# Patient Record
Sex: Male | Born: 1970 | Race: White | Hispanic: No | Marital: Single | Smoking: Current every day smoker
Health system: Southern US, Community
[De-identification: ages and names within clinical notes are randomized; demographics above are authoritative.]

## PROBLEM LIST (undated history)

## (undated) DIAGNOSIS — I82409 Acute embolism and thrombosis of unspecified deep veins of unspecified lower extremity: Secondary | ICD-10-CM

## (undated) DIAGNOSIS — F10239 Alcohol dependence with withdrawal, unspecified: Secondary | ICD-10-CM

## (undated) DIAGNOSIS — I2510 Atherosclerotic heart disease of native coronary artery without angina pectoris: Secondary | ICD-10-CM

## (undated) HISTORY — PX: CARDIAC CATHETERIZATION: SHX172

---

## 2016-11-11 ENCOUNTER — Encounter (HOSPITAL_COMMUNITY): Payer: Self-pay

## 2016-11-11 ENCOUNTER — Emergency Department (HOSPITAL_COMMUNITY): Payer: Self-pay

## 2016-11-11 ENCOUNTER — Emergency Department (HOSPITAL_COMMUNITY)
Admission: EM | Admit: 2016-11-11 | Discharge: 2016-11-12 | Disposition: A | Discharge status: Home / Self Care | Payer: Self-pay | Attending: Emergency Medicine | Admitting: Emergency Medicine

## 2016-11-11 ENCOUNTER — Emergency Department (HOSPITAL_COMMUNITY)
Admission: EM | Admit: 2016-11-11 | Discharge: 2016-11-11 | Disposition: A | Discharge status: Home / Self Care | Payer: Self-pay | Attending: Emergency Medicine | Admitting: Emergency Medicine

## 2016-11-11 DIAGNOSIS — Y999 Unspecified external cause status: Secondary | ICD-10-CM | POA: Insufficient documentation

## 2016-11-11 DIAGNOSIS — R55 Syncope and collapse: Secondary | ICD-10-CM | POA: Insufficient documentation

## 2016-11-11 DIAGNOSIS — F10920 Alcohol use, unspecified with intoxication, uncomplicated: Secondary | ICD-10-CM

## 2016-11-11 DIAGNOSIS — W1809XA Striking against other object with subsequent fall, initial encounter: Secondary | ICD-10-CM | POA: Insufficient documentation

## 2016-11-11 DIAGNOSIS — R072 Precordial pain: Secondary | ICD-10-CM | POA: Insufficient documentation

## 2016-11-11 DIAGNOSIS — S0083XA Contusion of other part of head, initial encounter: Secondary | ICD-10-CM | POA: Insufficient documentation

## 2016-11-11 DIAGNOSIS — I2510 Atherosclerotic heart disease of native coronary artery without angina pectoris: Secondary | ICD-10-CM | POA: Insufficient documentation

## 2016-11-11 DIAGNOSIS — Y939 Activity, unspecified: Secondary | ICD-10-CM | POA: Insufficient documentation

## 2016-11-11 DIAGNOSIS — R079 Chest pain, unspecified: Secondary | ICD-10-CM

## 2016-11-11 DIAGNOSIS — Y92481 Parking lot as the place of occurrence of the external cause: Secondary | ICD-10-CM | POA: Insufficient documentation

## 2016-11-11 DIAGNOSIS — F17200 Nicotine dependence, unspecified, uncomplicated: Secondary | ICD-10-CM | POA: Insufficient documentation

## 2016-11-11 DIAGNOSIS — Z5181 Encounter for therapeutic drug level monitoring: Secondary | ICD-10-CM | POA: Insufficient documentation

## 2016-11-11 DIAGNOSIS — F10120 Alcohol abuse with intoxication, uncomplicated: Secondary | ICD-10-CM | POA: Insufficient documentation

## 2016-11-11 HISTORY — DX: Atherosclerotic heart disease of native coronary artery without angina pectoris: I25.10

## 2016-11-11 HISTORY — DX: Acute embolism and thrombosis of unspecified deep veins of unspecified lower extremity: I82.409

## 2016-11-11 LAB — BASIC METABOLIC PANEL
Anion gap: 8 (ref 5–15)
BUN: 7 mg/dL (ref 6–20)
CO2: 24 mmol/L (ref 22–32)
Calcium: 8.8 mg/dL — ABNORMAL LOW (ref 8.9–10.3)
Chloride: 96 mmol/L — ABNORMAL LOW (ref 101–111)
Creatinine, Ser: 0.63 mg/dL (ref 0.61–1.24)
GFR calc Af Amer: >60 mL/min (ref >60)
GFR calc non Af Amer: >60 mL/min (ref >60)
Glucose, Bld: 98 mg/dL (ref 65–99)
Potassium: 4.6 mmol/L (ref 3.5–5.1)
Sodium: 128 mmol/L — ABNORMAL LOW (ref 135–145)

## 2016-11-11 LAB — CBC
HCT: 35.2 % — ABNORMAL LOW (ref 39.0–52.0)
Hemoglobin: 12.4 g/dL — ABNORMAL LOW (ref 13.0–17.0)
MCH: 38.4 pg — ABNORMAL HIGH (ref 26.0–34.0)
MCHC: 35.2 g/dL (ref 30.0–36.0)
MCV: 109 fL — ABNORMAL HIGH (ref 78.0–100.0)
Platelets: 119 10*3/uL — ABNORMAL LOW (ref 150–400)
RBC: 3.23 MIL/uL — ABNORMAL LOW (ref 4.22–5.81)
RDW: 12.7 % (ref 11.5–15.5)
WBC: 4.5 10*3/uL (ref 4.0–10.5)

## 2016-11-11 LAB — TROPONIN I: Troponin I: <0.03 ng/mL (ref <0.03)

## 2016-11-11 MED ORDER — FENTANYL CITRATE (PF) 100 MCG/2ML IJ SOLN
100.0000 ug | Freq: Once | INTRAMUSCULAR | Status: AC
  Administered 2016-11-11: 100 ug via INTRAVENOUS
  Filled 2016-11-11: qty 2

## 2016-11-11 MED ORDER — SODIUM CHLORIDE 0.9 % IV BOLUS (SEPSIS)
1000.0000 mL | Freq: Once | INTRAVENOUS | Status: AC
  Administered 2016-11-11: 1000 mL via INTRAVENOUS

## 2016-11-11 MED ORDER — SODIUM CHLORIDE 0.9 % IV BOLUS (SEPSIS)
1000.0000 mL | Freq: Once | INTRAVENOUS | Status: AC
  Administered 2016-11-12: 1000 mL via INTRAVENOUS

## 2016-11-11 NOTE — ED Notes (Signed)
Assumed care of patient from [place], [place]. Pt resting quietly at this time. No distress. No complaints.
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
## 2016-11-11 NOTE — ED Provider Notes (Signed)
[facility] Provider Note   CSN: [id number] Arrival date & time: 11/11/16  1421     History   Chief Complaint Chief Complaint  Patient presents with  . Chest Pain    HPI Derek Norton is a 46 y.o. male.   Chest Pain   This is a new problem. The current episode started less than 1 hour ago. The problem occurs constantly. The problem has not changed since onset.The pain is present in the substernal region. The pain is mild. The quality of the pain is described as brief and sharp. The pain radiates to the left shoulder. The symptoms are aggravated by deep breathing.    Past Medical History:  Diagnosis Date  . Coronary artery disease   . DVT (deep venous thrombosis) (HCC)     There are no active problems to display for this patient.   Past Surgical History:  Procedure Laterality Date  . CARDIAC CATHETERIZATION         Home Medications    Prior to Admission medications   Not on File    Family History History reviewed. No pertinent family history.  Social History Social History  Substance Use Topics  . Smoking status: Not on file  . Smokeless tobacco: Never Used  . Alcohol use Not on file     Allergies   Penicillins   Review of Systems Review of Systems  Cardiovascular: Positive for chest pain.  All other systems reviewed and are negative.    Physical Exam Updated Vital Signs BP 102/67   Pulse 72   Temp 98.5 F (36.9 C) (Oral)   Resp 11   Ht  (1.778 m)   Wt 150 lb (68 kg)   SpO2 99%   BMI 21.52 kg/m   Physical Exam  Constitutional: He is oriented to person, place, and time. He appears well-developed and well-nourished.  HENT:  Head: Normocephalic and atraumatic.  Eyes: Conjunctivae and EOM are normal.  Neck: Normal range of motion.  Cardiovascular: Normal rate and regular rhythm.  Exam reveals no gallop and no friction rub.   No murmur heard. Pulmonary/Chest: Effort normal. No respiratory distress. He has no wheezes.  He exhibits no tenderness.  Abdominal: Soft. He exhibits no distension.  Musculoskeletal: Normal range of motion. He exhibits no edema or deformity.  Neurological: He is alert and oriented to person, place, and time. No cranial nerve deficit.  Nursing note and vitals reviewed.    ED Treatments / Results  Labs (all labs ordered are listed, but only abnormal results are displayed) Labs Reviewed  BASIC METABOLIC PANEL - Abnormal; Notable for the following:       Result Value   Sodium 128 (*)    Chloride 96 (*)    Calcium 8.8 (*)    All other components within normal limits  CBC - Abnormal; Notable for the following:    RBC 3.23 (*)    Hemoglobin 12.4 (*)    HCT 35.2 (*)    MCV 109.0 (*)    MCH 38.4 (*)    Platelets 119 (*)    All other components within normal limits  TROPONIN I    EKG  EKG Interpretation  Date/Time:  Wednesday November 11 2016 14:35:17 EDT Ventricular Rate:  76 PR Interval:    QRS Duration: 88 QT Interval:  389 QTC Calculation: 438 R Axis:   80 Text Interpretation:  Sinus rhythm ST elev, probable normal early repol pattern No old tracing to compare Confirmed by  Jensen Cheramie MD, Barbara Cower [phone]) on 11/11/2016 2:38:03 PM      PREVIOUS ECG:    Radiology Dg Chest 2 View  Result Date: 11/11/2016 CLINICAL DATA:  Chronic chest pain, increasing today. EXAM: CHEST  2 VIEW COMPARISON:  None. FINDINGS: The cardiomediastinal silhouette is unremarkable. There is no evidence of focal airspace disease, pulmonary edema, suspicious pulmonary nodule/mass, pleural effusion, or pneumothorax. No acute bony abnormalities are identified. IMPRESSION: No active cardiopulmonary disease. Electronically Signed   By: Harmon Pier M.D.   On: 11/11/2016 16:05    Procedures Procedures (including critical care time)  Medications Ordered in ED Medications  sodium chloride 0.9 % bolus 1,000 mL (0 mLs Intravenous Stopped 11/11/16 1643)  fentaNYL (SUBLIMAZE) injection 100 mcg (100 mcg  Intravenous Given 11/11/16 1613)     Initial Impression / Assessment and Plan / ED Course  I have reviewed the triage vital signs and the nursing notes.  Pertinent labs & imaging results that were available during my care of the patient were reviewed by me and considered in my medical decision making (see chart for details).     h/o ACS, negative troponin, ecg similar to previous. Left AMA before full workup to be completed which would have included further observation for return of pain, vital signs, arrhythmia and a second ecg and second troponin to ensure no ischemia. competemnt to make decisiion. Clinically sober at time of departure.   Final Clinical Impressions(s) / ED Diagnoses   Final diagnoses:  Nonspecific chest pain      Marily Memos, MD 11/11/16 2059
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
## 2016-11-11 NOTE — ED Notes (Signed)
Pt removed c collar himself in room
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
## 2016-11-12 LAB — COMPREHENSIVE METABOLIC PANEL
ALT: 117 U/L — ABNORMAL HIGH (ref 17–63)
AST: 75 U/L — ABNORMAL HIGH (ref 15–41)
Albumin: 3.7 g/dL (ref 3.5–5.0)
Alkaline Phosphatase: 70 U/L (ref 38–126)
Anion gap: 9 (ref 5–15)
BUN: 6 mg/dL (ref 6–20)
CO2: 22 mmol/L (ref 22–32)
Calcium: 8.1 mg/dL — ABNORMAL LOW (ref 8.9–10.3)
Chloride: 101 mmol/L (ref 101–111)
Creatinine, Ser: 0.67 mg/dL (ref 0.61–1.24)
GFR calc Af Amer: >60 mL/min (ref >60)
GFR calc non Af Amer: >60 mL/min (ref >60)
Glucose, Bld: 95 mg/dL (ref 65–99)
Potassium: 3.8 mmol/L (ref 3.5–5.1)
Sodium: 132 mmol/L — ABNORMAL LOW (ref 135–145)
Total Bilirubin: 0.3 mg/dL (ref 0.3–1.2)
Total Protein: 6.3 g/dL — ABNORMAL LOW (ref 6.5–8.1)

## 2016-11-12 LAB — CBC WITH DIFFERENTIAL/PLATELET
Basophils Absolute: 0 10*3/uL (ref 0.0–0.1)
Basophils Relative: 1 %
Eosinophils Absolute: 0 10*3/uL (ref 0.0–0.7)
Eosinophils Relative: 1 %
HCT: 33.1 % — ABNORMAL LOW (ref 39.0–52.0)
Hemoglobin: 11.7 g/dL — ABNORMAL LOW (ref 13.0–17.0)
Lymphocytes Relative: 32 %
Lymphs Abs: 1 10*3/uL (ref 0.7–4.0)
MCH: 38.5 pg — ABNORMAL HIGH (ref 26.0–34.0)
MCHC: 35.3 g/dL (ref 30.0–36.0)
MCV: 108.9 fL — ABNORMAL HIGH (ref 78.0–100.0)
Monocytes Absolute: 0.3 10*3/uL (ref 0.1–1.0)
Monocytes Relative: 11 %
Neutro Abs: 1.7 10*3/uL (ref 1.7–7.7)
Neutrophils Relative %: 55 %
Platelets: 116 10*3/uL — ABNORMAL LOW (ref 150–400)
RBC: 3.04 MIL/uL — ABNORMAL LOW (ref 4.22–5.81)
RDW: 12.8 % (ref 11.5–15.5)
WBC: 3.2 10*3/uL — ABNORMAL LOW (ref 4.0–10.5)

## 2016-11-12 LAB — I-STAT TROPONIN, ED: Troponin i, poc: 0 ng/mL (ref 0.00–0.08)

## 2016-11-12 LAB — RAPID URINE DRUG SCREEN, HOSP PERFORMED
Amphetamines: NOT DETECTED
Barbiturates: NOT DETECTED
Benzodiazepines: NOT DETECTED
Cocaine: NOT DETECTED
Opiates: NOT DETECTED
Tetrahydrocannabinol: NOT DETECTED

## 2016-11-12 LAB — ETHANOL: Alcohol, Ethyl (B): 216 mg/dL — ABNORMAL HIGH (ref <5)

## 2016-11-12 MED ORDER — ACETAMINOPHEN 500 MG PO TABS
1000.0000 mg | ORAL_TABLET | Freq: Once | ORAL | Status: AC
  Administered 2016-11-12: 1000 mg via ORAL
  Filled 2016-11-12: qty 2

## 2016-11-12 NOTE — ED Notes (Signed)
Patient states "I'm ready to go. I don't want anything to eat, I'm just ready to go."

## 2016-11-12 NOTE — ED Notes (Signed)
Patient ambulating with no assistance or difficulty. Gait steady at this time.

## 2017-12-07 ENCOUNTER — Encounter (HOSPITAL_COMMUNITY): Payer: Self-pay | Admitting: Emergency Medicine

## 2017-12-07 ENCOUNTER — Emergency Department (HOSPITAL_COMMUNITY): Payer: Self-pay

## 2017-12-07 ENCOUNTER — Other Ambulatory Visit: Payer: Self-pay

## 2017-12-07 ENCOUNTER — Emergency Department (HOSPITAL_COMMUNITY)
Admission: EM | Admit: 2017-12-07 | Discharge: 2017-12-07 | Disposition: A | Discharge status: Home / Self Care | Payer: Self-pay | Attending: Emergency Medicine | Admitting: Emergency Medicine

## 2017-12-07 DIAGNOSIS — Z590 Homelessness: Secondary | ICD-10-CM | POA: Insufficient documentation

## 2017-12-07 DIAGNOSIS — F10939 Alcohol use, unspecified with withdrawal, unspecified: Secondary | ICD-10-CM

## 2017-12-07 DIAGNOSIS — F10239 Alcohol dependence with withdrawal, unspecified: Secondary | ICD-10-CM

## 2017-12-07 DIAGNOSIS — F329 Major depressive disorder, single episode, unspecified: Secondary | ICD-10-CM | POA: Insufficient documentation

## 2017-12-07 DIAGNOSIS — F17200 Nicotine dependence, unspecified, uncomplicated: Secondary | ICD-10-CM | POA: Insufficient documentation

## 2017-12-07 DIAGNOSIS — Y939 Activity, unspecified: Secondary | ICD-10-CM | POA: Insufficient documentation

## 2017-12-07 DIAGNOSIS — F1010 Alcohol abuse, uncomplicated: Secondary | ICD-10-CM

## 2017-12-07 DIAGNOSIS — Z5329 Procedure and treatment not carried out because of patient's decision for other reasons: Secondary | ICD-10-CM | POA: Insufficient documentation

## 2017-12-07 DIAGNOSIS — R0602 Shortness of breath: Secondary | ICD-10-CM | POA: Insufficient documentation

## 2017-12-07 DIAGNOSIS — W0110XA Fall on same level from slipping, tripping and stumbling with subsequent striking against unspecified object, initial encounter: Secondary | ICD-10-CM | POA: Insufficient documentation

## 2017-12-07 DIAGNOSIS — R1084 Generalized abdominal pain: Secondary | ICD-10-CM | POA: Insufficient documentation

## 2017-12-07 DIAGNOSIS — R0789 Other chest pain: Secondary | ICD-10-CM | POA: Insufficient documentation

## 2017-12-07 DIAGNOSIS — Y929 Unspecified place or not applicable: Secondary | ICD-10-CM | POA: Insufficient documentation

## 2017-12-07 DIAGNOSIS — I2510 Atherosclerotic heart disease of native coronary artery without angina pectoris: Secondary | ICD-10-CM | POA: Insufficient documentation

## 2017-12-07 DIAGNOSIS — F10129 Alcohol abuse with intoxication, unspecified: Secondary | ICD-10-CM | POA: Insufficient documentation

## 2017-12-07 DIAGNOSIS — Y999 Unspecified external cause status: Secondary | ICD-10-CM | POA: Insufficient documentation

## 2017-12-07 HISTORY — DX: Alcohol dependence with withdrawal, unspecified: F10.239

## 2017-12-07 HISTORY — DX: Alcohol use, unspecified with withdrawal, unspecified: F10.939

## 2017-12-07 LAB — COMPREHENSIVE METABOLIC PANEL
ALT: 65 U/L — ABNORMAL HIGH (ref 17–63)
AST: 109 U/L — ABNORMAL HIGH (ref 15–41)
Albumin: 4.3 g/dL (ref 3.5–5.0)
Alkaline Phosphatase: 82 U/L (ref 38–126)
Anion gap: 12 (ref 5–15)
BUN: 7 mg/dL (ref 6–20)
CO2: 23 mmol/L (ref 22–32)
Calcium: 8.3 mg/dL — ABNORMAL LOW (ref 8.9–10.3)
Chloride: 97 mmol/L — ABNORMAL LOW (ref 101–111)
Creatinine, Ser: 0.49 mg/dL — ABNORMAL LOW (ref 0.61–1.24)
GFR calc Af Amer: >60 mL/min (ref >60)
GFR calc non Af Amer: >60 mL/min (ref >60)
Glucose, Bld: 79 mg/dL (ref 65–99)
Potassium: 3.5 mmol/L (ref 3.5–5.1)
Sodium: 132 mmol/L — ABNORMAL LOW (ref 135–145)
Total Bilirubin: 0.7 mg/dL (ref 0.3–1.2)
Total Protein: 7.1 g/dL (ref 6.5–8.1)

## 2017-12-07 LAB — CBC WITH DIFFERENTIAL/PLATELET
Basophils Absolute: 0 10*3/uL (ref 0.0–0.1)
Basophils Relative: 1 %
Eosinophils Absolute: 0.1 10*3/uL (ref 0.0–0.7)
Eosinophils Relative: 2 %
HCT: 37.4 % — ABNORMAL LOW (ref 39.0–52.0)
Hemoglobin: 13.3 g/dL (ref 13.0–17.0)
Lymphocytes Relative: 49 %
Lymphs Abs: 1.3 10*3/uL (ref 0.7–4.0)
MCH: 38.7 pg — ABNORMAL HIGH (ref 26.0–34.0)
MCHC: 35.6 g/dL (ref 30.0–36.0)
MCV: 108.7 fL — ABNORMAL HIGH (ref 78.0–100.0)
Monocytes Absolute: 0.3 10*3/uL (ref 0.1–1.0)
Monocytes Relative: 10 %
Neutro Abs: 1 10*3/uL — ABNORMAL LOW (ref 1.7–7.7)
Neutrophils Relative %: 38 %
Platelets: 108 10*3/uL — ABNORMAL LOW (ref 150–400)
RBC: 3.44 MIL/uL — ABNORMAL LOW (ref 4.22–5.81)
RDW: 15.2 % (ref 11.5–15.5)
WBC: 2.7 10*3/uL — ABNORMAL LOW (ref 4.0–10.5)

## 2017-12-07 LAB — ETHANOL: Alcohol, Ethyl (B): 327 mg/dL (ref <10)

## 2017-12-07 LAB — LIPASE, BLOOD: Lipase: 46 U/L (ref 11–51)

## 2017-12-07 LAB — TROPONIN I: Troponin I: <0.03 ng/mL (ref <0.03)

## 2017-12-07 MED ORDER — SODIUM CHLORIDE 0.9 % IV BOLUS
1000.0000 mL | Freq: Once | INTRAVENOUS | Status: AC
  Administered 2017-12-07: 1000 mL via INTRAVENOUS

## 2017-12-07 MED ORDER — THIAMINE HCL 100 MG/ML IJ SOLN
100.0000 mg | Freq: Once | INTRAMUSCULAR | Status: AC
Start: 2017-12-07 — End: 2017-12-07
  Administered 2017-12-07: 100 mg via INTRAVENOUS
  Filled 2017-12-07: qty 2

## 2017-12-07 NOTE — ED Triage Notes (Signed)
Pt c/o chest pain x 2 days, diarrhea x 3 days, pt has hx of stent placement, pt intoxicated stating he has drank 1.5 cases of beer today, per [facility] pt CBG 98

## 2017-12-07 NOTE — ED Provider Notes (Signed)
[facility] EMERGENCY DEPARTMENT Provider Note   CSN: [id number] Arrival date & time: 12/07/17  0131  Time seen 03:25 AM   History   Chief Complaint Chief Complaint  Patient presents with  . Chest Pain   Level 5 caveat for alcohol intoxication  HPI Derek Norton is a 47 y.o. male.  HPI patient states for the past 3 days he has been having fainting spells.  He states he feels lightheaded and gets dizzy when he stands up.  He states he fell and cannot tell me what day and hit his head.  Patient also states he started getting chest pain about 2 days ago and points to his left lower lateral chest.  He states he gets sharp pain that comes and goes and lasts about 10 minutes.  He feels short of breath sometimes.  Nothing makes the chest pain worse, nothing makes it feel better.  Patient has a history of cardiac stent.  Patient also states has been having nausea and vomiting the past 2 days.  When I ask him have a times a day he states "all day long".  When I initially asked him when the vomiting starts he states "when I was in [place]".  When asked patient how much he drinks he states "too much".  He states he drinks 1-1/2 cases a day of beer.  He states he is depressed but he denies suicidal ideation. Pt also c/o diffuse abdominal pain.   PCP Patient, No Pcp Per   Past Medical History:  Diagnosis Date  . Alcohol withdrawal (HCC) 12/07/2017   pt states he "goes into DT's"  . Coronary artery disease   . DVT (deep venous thrombosis) (HCC)     There are no active problems to display for this patient.   Past Surgical History:  Procedure Laterality Date  . CARDIAC CATHETERIZATION          Home Medications    Prior to Admission medications   Not on File    Family History History reviewed. No pertinent family history.  Social History Social History   Tobacco Use  . Smoking status: Current Every Day Smoker    Packs/day: 1.00  . Smokeless tobacco: Never Used    Substance Use Topics  . Alcohol use: Yes    Alcohol/week: 21.6 oz    Types: 36 Cans of beer per week  . Drug use: No     Allergies   Penicillins   Review of Systems Review of Systems  All other systems reviewed and are negative.    Physical Exam Updated Vital Signs BP 101/67   Pulse 86   Resp 17   Ht  (1.803 m)   Wt 68 kg (150 lb)   SpO2 97%   BMI 20.92 kg/m   Vital signs normal    Physical Exam  Constitutional: He is oriented to person, place, and time.  Non-toxic appearance. He does not appear ill. No distress.  Thin male, when he talks to me he looks away from me and mumbles.  I have to keep asking him to turn his head and look at me when he talks.  HENT:  Head: Normocephalic and atraumatic.  Right Ear: External ear normal.  Left Ear: External ear normal.  Nose: Nose normal. No mucosal edema or rhinorrhea.  Mouth/Throat: Mucous membranes are dry. No dental abscesses or uvula swelling.  Eyes: Pupils are equal, round, and reactive to light. Conjunctivae and EOM are normal.  Neck: Normal range  of motion and full passive range of motion without pain. Neck supple.  Cardiovascular: Normal rate, regular rhythm and normal heart sounds. Exam reveals no gallop and no friction rub.  No murmur heard. Pulmonary/Chest: Effort normal and breath sounds normal. No respiratory distress. He has no wheezes. He has no rhonchi. He has no rales. He exhibits no tenderness and no crepitus.  Abdominal: Soft. Normal appearance and bowel sounds are normal. He exhibits no distension. There is generalized tenderness. There is no rebound and no guarding.  Musculoskeletal: Normal range of motion. He exhibits no edema or tenderness.  Moves all extremities well.   Neurological: He is alert and oriented to person, place, and time. He has normal strength. No cranial nerve deficit.  Skin: Skin is warm, dry and intact. No rash noted. No erythema. No pallor.  Psychiatric: His affect is blunt.  His speech is delayed. He is slowed. He expresses no suicidal ideation. He expresses no suicidal plans.  Mumbles when he talks  Nursing note and vitals reviewed.    ED Treatments / Results  Labs (all labs ordered are listed, but only abnormal results are displayed) Results for orders placed or performed during the hospital encounter of 12/07/17  Comprehensive metabolic panel  Result Value Ref Range   Sodium 132 (L) 135 - 145 mmol/L   Potassium 3.5 3.5 - 5.1 mmol/L   Chloride 97 (L) 101 - 111 mmol/L   CO2 23 22 - 32 mmol/L   Glucose, Bld 79 65 - 99 mg/dL   BUN 7 6 - 20 mg/dL   Creatinine, Ser 1.61 (L) 0.61 - 1.24 mg/dL   Calcium 8.3 (L) 8.9 - 10.3 mg/dL   Total Protein 7.1 6.5 - 8.1 g/dL   Albumin 4.3 3.5 - 5.0 g/dL   AST 096 (H) 15 - 41 U/L   ALT 65 (H) 17 - 63 U/L   Alkaline Phosphatase 82 38 - 126 U/L   Total Bilirubin 0.7 0.3 - 1.2 mg/dL   GFR calc non Af Amer >60 >60 mL/min   GFR calc Af Amer >60 >60 mL/min   Anion gap 12 5 - 15  Ethanol  Result Value Ref Range   Alcohol, Ethyl (B) 327 (HH) <10 mg/dL  Troponin I  Result Value Ref Range   Troponin I <0.03 <0.03 ng/mL  CBC with Differential  Result Value Ref Range   WBC 2.7 (L) 4.0 - 10.5 K/uL   RBC 3.44 (L) 4.22 - 5.81 MIL/uL   Hemoglobin 13.3 13.0 - 17.0 g/dL   HCT 04.5 (L) 40.9 - 81.1 %   MCV 108.7 (H) 78.0 - 100.0 fL   MCH 38.7 (H) 26.0 - 34.0 pg   MCHC 35.6 30.0 - 36.0 g/dL   RDW 91.4 78.2 - 95.6 %   Platelets 108 (L) 150 - 400 K/uL   Neutrophils Relative % 38 %   Neutro Abs 1.0 (L) 1.7 - 7.7 K/uL   Lymphocytes Relative 49 %   Lymphs Abs 1.3 0.7 - 4.0 K/uL   Monocytes Relative 10 %   Monocytes Absolute 0.3 0.1 - 1.0 K/uL   Eosinophils Relative 2 %   Eosinophils Absolute 0.1 0.0 - 0.7 K/uL   Basophils Relative 1 %   Basophils Absolute 0.0 0.0 - 0.1 K/uL  Lipase, blood  Result Value Ref Range   Lipase 46 11 - 51 U/L   Laboratory interpretation all normal except thrombocytopenia consistent with  alcoholism, alcohol intoxication, Mild elevation of LFTs   EKG EKG  Interpretation  Date/Time:  Tuesday Dec 07 2017 01:36:18 EDT Ventricular Rate:  79 PR Interval:    QRS Duration: 87 QT Interval:  413 QTC Calculation: 474 R Axis:   88 Text Interpretation:  Sinus rhythm ST elev, probable normal early repol pattern No significant change since last tracing 12 Nov 2016 Confirmed by Devoria Albe ([id number]) on 12/07/2017 1:49:37 AM   Radiology Ct Head Wo Contrast  Result Date: 12/07/2017 CLINICAL DATA:  47 y/o M; fall with head injury 2 days ago with persistent dizziness. EXAM: CT HEAD WITHOUT CONTRAST TECHNIQUE: Contiguous axial images were obtained from the base of the skull through the vertex without intravenous contrast. COMPARISON:  11/12/2016 chest radiograph FINDINGS: Brain: No evidence of acute infarction, hemorrhage, hydrocephalus, extra-axial collection or mass lesion/mass effect. 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 Stable mild chronic microvascular ischemic changes and advanced parenchymal volume loss of the brain. Vascular: No hyperdense vessel or unexpected calcification. Skull: Normal. Negative for fracture or focal lesion. Sinuses/Orbits: No acute finding. Other: None. IMPRESSION: 1. No acute intracranial abnormality identified. 2. Stable mild chronic microvascular ischemic changes and advanced parenchymal volume loss of the brain. 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 Electronically Signed   By: Mitzi Hansen M.D.   On: 12/07/2017 04:17    Procedures Procedures (including critical care time)  Medications Ordered in ED Medications  sodium chloride 0.9 % bolus 1,000 mL (0 mLs Intravenous Stopped 12/07/17 0553)  sodium chloride 0.9 % bolus 1,000 mL (1,000 mLs Intravenous New Bag/Given 12/07/17 0242)     Initial Impression / Assessment and Plan / ED Course  I have reviewed the triage  vital signs and the nursing notes.  Pertinent labs & imaging results that were available during my care of the patient were reviewed by me and considered in my medical decision making (see chart for details).     Patient was given IV fluids for dehydration.  Had was done due to history of hitting his head, laboratory testing was done.  Nursing staff states patient is homeless and he wants to be admitted for placement.  When I talked to patient he states he wants to quit drinking.  He wants to be admitted to be detox.  He states he is never gone to detox.  However patient states he has had a withdrawal seizure before "a couple years ago".  He states he has only gone without drinking a day.  Patient is still very intoxicated.  I will watch him in the ED and see if he still feels like he wants to be detoxed when he starts to sober up.  08:10 AM Dr Estell Harpin will recheck when he sobers up to see if he is still interested in detox.   Final Clinical Impressions(s) / ED Diagnoses   Final diagnoses:  Alcohol abuse    Disposition pending  Devoria Albe, MD, Concha Pyo, MD 12/07/17 [phone]

## 2017-12-07 NOTE — ED Provider Notes (Signed)
Patient left AMA before I was able to discuss treatment plan for him   Bethann Berkshire, MD 12/07/17 1122

## 2017-12-07 NOTE — ED Notes (Signed)
CRITICAL VALUE ALERT  Critical Value:  ETOH 327 Date & Time Notied:12/07/17 @ 0403 Provider Notified:I Knapp Orders Received/Actions taken: None yet

## 2017-12-11 ENCOUNTER — Other Ambulatory Visit: Payer: Self-pay

## 2017-12-11 ENCOUNTER — Emergency Department (HOSPITAL_COMMUNITY)
Admission: EM | Admit: 2017-12-11 | Discharge: 2017-12-11 | Disposition: A | Discharge status: Home / Self Care | Payer: Self-pay | Attending: Emergency Medicine | Admitting: Emergency Medicine

## 2017-12-11 ENCOUNTER — Emergency Department (HOSPITAL_COMMUNITY): Payer: Self-pay

## 2017-12-11 ENCOUNTER — Encounter (HOSPITAL_COMMUNITY): Payer: Self-pay | Admitting: Emergency Medicine

## 2017-12-11 DIAGNOSIS — R112 Nausea with vomiting, unspecified: Secondary | ICD-10-CM | POA: Insufficient documentation

## 2017-12-11 DIAGNOSIS — R079 Chest pain, unspecified: Secondary | ICD-10-CM | POA: Insufficient documentation

## 2017-12-11 DIAGNOSIS — I259 Chronic ischemic heart disease, unspecified: Secondary | ICD-10-CM | POA: Insufficient documentation

## 2017-12-11 DIAGNOSIS — R197 Diarrhea, unspecified: Secondary | ICD-10-CM | POA: Insufficient documentation

## 2017-12-11 DIAGNOSIS — Z79899 Other long term (current) drug therapy: Secondary | ICD-10-CM | POA: Insufficient documentation

## 2017-12-11 DIAGNOSIS — F17210 Nicotine dependence, cigarettes, uncomplicated: Secondary | ICD-10-CM | POA: Insufficient documentation

## 2017-12-11 DIAGNOSIS — R1084 Generalized abdominal pain: Secondary | ICD-10-CM | POA: Insufficient documentation

## 2017-12-11 LAB — I-STAT TROPONIN, ED
Troponin i, poc: 0 ng/mL (ref 0.00–0.08)
Troponin i, poc: 0.01 ng/mL (ref 0.00–0.08)

## 2017-12-11 LAB — COMPREHENSIVE METABOLIC PANEL
ALT: 65 U/L — ABNORMAL HIGH (ref 17–63)
AST: 133 U/L — ABNORMAL HIGH (ref 15–41)
Albumin: 4.2 g/dL (ref 3.5–5.0)
Alkaline Phosphatase: 82 U/L (ref 38–126)
Anion gap: 13 (ref 5–15)
BUN: 5 mg/dL — ABNORMAL LOW (ref 6–20)
CO2: 24 mmol/L (ref 22–32)
Calcium: 9 mg/dL (ref 8.9–10.3)
Chloride: 97 mmol/L — ABNORMAL LOW (ref 101–111)
Creatinine, Ser: 0.51 mg/dL — ABNORMAL LOW (ref 0.61–1.24)
GFR calc Af Amer: >60 mL/min (ref >60)
GFR calc non Af Amer: >60 mL/min (ref >60)
Glucose, Bld: 92 mg/dL (ref 65–99)
Potassium: 3.9 mmol/L (ref 3.5–5.1)
Sodium: 134 mmol/L — ABNORMAL LOW (ref 135–145)
Total Bilirubin: 1.3 mg/dL — ABNORMAL HIGH (ref 0.3–1.2)
Total Protein: 7 g/dL (ref 6.5–8.1)

## 2017-12-11 LAB — CBC WITH DIFFERENTIAL/PLATELET
Basophils Absolute: 0 10*3/uL (ref 0.0–0.1)
Basophils Relative: 1 %
Eosinophils Absolute: 0.1 10*3/uL (ref 0.0–0.7)
Eosinophils Relative: 2 %
HCT: 35.8 % — ABNORMAL LOW (ref 39.0–52.0)
Hemoglobin: 12.6 g/dL — ABNORMAL LOW (ref 13.0–17.0)
Lymphocytes Relative: 43 %
Lymphs Abs: 1.3 10*3/uL (ref 0.7–4.0)
MCH: 38.3 pg — ABNORMAL HIGH (ref 26.0–34.0)
MCHC: 35.2 g/dL (ref 30.0–36.0)
MCV: 108.8 fL — ABNORMAL HIGH (ref 78.0–100.0)
Monocytes Absolute: 0.3 10*3/uL (ref 0.1–1.0)
Monocytes Relative: 8 %
Neutro Abs: 1.4 10*3/uL — ABNORMAL LOW (ref 1.7–7.7)
Neutrophils Relative %: 46 %
Platelets: 89 10*3/uL — ABNORMAL LOW (ref 150–400)
RBC: 3.29 MIL/uL — ABNORMAL LOW (ref 4.22–5.81)
RDW: 14.7 % (ref 11.5–15.5)
WBC: 3 10*3/uL — ABNORMAL LOW (ref 4.0–10.5)

## 2017-12-11 LAB — URINALYSIS, ROUTINE W REFLEX MICROSCOPIC
Bilirubin Urine: NEGATIVE
Glucose, UA: NEGATIVE mg/dL
Hgb urine dipstick: NEGATIVE
Ketones, ur: NEGATIVE mg/dL
Leukocytes, UA: NEGATIVE
Nitrite: NEGATIVE
Protein, ur: NEGATIVE mg/dL
Specific Gravity, Urine: 1.003 — ABNORMAL LOW (ref 1.005–1.030)
pH: 6 (ref 5.0–8.0)

## 2017-12-11 LAB — D-DIMER, QUANTITATIVE: D-Dimer, Quant: 0.58 ug/mL-FEU — ABNORMAL HIGH (ref 0.00–0.50)

## 2017-12-11 LAB — LIPASE, BLOOD: Lipase: 42 U/L (ref 11–51)

## 2017-12-11 LAB — ETHANOL: Alcohol, Ethyl (B): 214 mg/dL — ABNORMAL HIGH (ref <10)

## 2017-12-11 MED ORDER — IOPAMIDOL (ISOVUE-370) INJECTION 76%
100.0000 mL | Freq: Once | INTRAVENOUS | Status: AC | PRN
  Administered 2017-12-11: 100 mL via INTRAVENOUS

## 2017-12-11 MED ORDER — SODIUM CHLORIDE 0.9 % IV BOLUS
1000.0000 mL | Freq: Once | INTRAVENOUS | Status: AC
  Administered 2017-12-11: 1000 mL via INTRAVENOUS

## 2017-12-11 MED ORDER — ONDANSETRON 4 MG PO TBDP
ORAL_TABLET | ORAL | Status: AC
  Administered 2017-12-11: 4 mg
  Filled 2017-12-11: qty 1

## 2017-12-11 NOTE — ED Provider Notes (Signed)
[facility] EMERGENCY DEPARTMENT Provider Note   CSN: [id number] Arrival date & time: 12/11/17  0011     History   Chief Complaint Chief Complaint  Patient presents with  . Chest Pain    HPI Derek Norton is a 47 y.o. male.  Patient presents to the ER with multiple complaints.  He reports that he has been having nausea, vomiting and diarrhea " all day long".  He reports that he has been having diffuse abdominal cramping.  He also has developed chest pain.  He feels like he is dehydrated.     Past Medical History:  Diagnosis Date  . Alcohol withdrawal (HCC) 12/07/2017   pt states he "goes into DT's"  . Coronary artery disease   . DVT (deep venous thrombosis) (HCC)     There are no active problems to display for this patient.   Past Surgical History:  Procedure Laterality Date  . CARDIAC CATHETERIZATION          Home Medications    Prior to Admission medications   Medication Sig Start Date End Date Taking? Authorizing Provider  acetaminophen (TYLENOL) 325 MG tablet Take 650 mg by mouth every 6 (six) hours as needed for mild pain or headache.    [provider]    Family History History reviewed. No pertinent family history.  Social History Social History   Tobacco Use  . Smoking status: Current Every Day Smoker    Packs/day: 1.00  . Smokeless tobacco: Never Used  Substance Use Topics  . Alcohol use: Yes    Alcohol/week: 21.6 oz    Types: 36 Cans of beer per week  . Drug use: No     Allergies   Penicillins   Review of Systems Review of Systems  Cardiovascular: Positive for chest pain.  Gastrointestinal: Positive for abdominal pain, diarrhea, nausea and vomiting.  All other systems reviewed and are negative.    Physical Exam Updated Vital Signs BP 110/72   Pulse 82   Temp 98.4 F (36.9 C) (Oral)   Resp 13   Ht  (1.803 m)   Wt 68 kg (150 lb)   SpO2 94%   BMI 20.92 kg/m   Physical Exam  Constitutional: He is  oriented to person, place, and time. He appears well-developed and well-nourished. No distress.  HENT:  Head: Normocephalic and atraumatic.  Right Ear: Hearing normal.  Left Ear: Hearing normal.  Nose: Nose normal.  Mouth/Throat: Oropharynx is clear and moist and mucous membranes are normal.  Eyes: Pupils are equal, round, and reactive to light. Conjunctivae and EOM are normal.  Neck: Normal range of motion. Neck supple.  Cardiovascular: Regular rhythm, S1 normal and S2 normal. Exam reveals no gallop and no friction rub.  No murmur heard. Pulmonary/Chest: Effort normal and breath sounds normal. No respiratory distress. He exhibits no tenderness.  Abdominal: Soft. Normal appearance and bowel sounds are normal. There is no hepatosplenomegaly. There is tenderness (generalized). There is no rebound, no guarding, no tenderness at McBurney's point and negative Murphy's sign. No hernia.  Musculoskeletal: Normal range of motion.  Neurological: He is alert and oriented to person, place, and time. He has normal strength. No cranial nerve deficit or sensory deficit. Coordination normal. GCS eye subscore is 4. GCS verbal subscore is 5. GCS motor subscore is 6.  Skin: Skin is warm, dry and intact. No rash noted. No cyanosis.  Psychiatric: He has a normal mood and affect. His speech is normal and behavior is  normal. Thought content normal.  Nursing note and vitals reviewed.    ED Treatments / Results  Labs (all labs ordered are listed, but only abnormal results are displayed) Labs Reviewed  CBC WITH DIFFERENTIAL/PLATELET - Abnormal; Notable for the following components:      Result Value   WBC 3.0 (*)    RBC 3.29 (*)    Hemoglobin 12.6 (*)    HCT 35.8 (*)    MCV 108.8 (*)    MCH 38.3 (*)    Platelets 89 (*)    Neutro Abs 1.4 (*)    All other components within normal limits  COMPREHENSIVE METABOLIC PANEL - Abnormal; Notable for the following components:   Sodium 134 (*)    Chloride 97 (*)     BUN 5 (*)    Creatinine, Ser 0.51 (*)    AST 133 (*)    ALT 65 (*)    Total Bilirubin 1.3 (*)    All other components within normal limits  URINALYSIS, ROUTINE W REFLEX MICROSCOPIC - Abnormal; Notable for the following components:   Specific Gravity, Urine 1.003 (*)    All other components within normal limits  ETHANOL - Abnormal; Notable for the following components:   Alcohol, Ethyl (B) 214 (*)    All other components within normal limits  D-DIMER, QUANTITATIVE (NOT AT [facility]) - Abnormal; Notable for the following components:   D-Dimer, Quant 0.58 (*)    All other components within normal limits  LIPASE, BLOOD  I-STAT TROPONIN, ED  I-STAT TROPONIN, ED    EKG None  Radiology Ct Angio Chest Pe W Or Wo Contrast  Result Date: 12/11/2017 CLINICAL DATA:  Acute onset of generalized chest pain, nausea, vomiting and diarrhea. EXAM: CT ANGIOGRAPHY CHEST WITH CONTRAST TECHNIQUE: Multidetector CT imaging of the chest was performed using the standard protocol during bolus administration of intravenous contrast. Multiplanar CT image reconstructions and MIPs were obtained to evaluate the vascular anatomy. CONTRAST:  ISOVUE-370 IOPAMIDOL (ISOVUE-370) INJECTION 76% COMPARISON:  Chest radiograph performed 11/11/2016 FINDINGS: Cardiovascular:  There is no evidence of pulmonary embolus. The heart is normal in size. The thoracic aorta is unremarkable. The great vessels are within normal limits. Mediastinum/Nodes: The mediastinum is unremarkable. No mediastinal lymphadenopathy is seen. No pericardial effusion is identified. The thyroid gland is unremarkable. No axillary lymphadenopathy is seen. Lungs/Pleura: Mild bilateral dependent subsegmental atelectasis is noted. No pleural effusion or pneumothorax is seen. No masses are identified. Upper Abdomen: The visualized portions of the liver and spleen are unremarkable. A small mildly complex cyst is noted at the upper pole of the left kidney, with layering  calcification. Musculoskeletal: No acute osseous abnormalities are identified. The visualized musculature is unremarkable in appearance. Review of the MIP images confirms the above findings. IMPRESSION: 1. No evidence of pulmonary embolus. 2. Mild bilateral dependent subsegmental atelectasis noted; lungs otherwise clear. 3. Small mildly complex cyst at the upper pole of the left kidney, with layering calcification. Electronically Signed   By: Roanna Raider M.D.   On: 12/11/2017 04:20    Procedures Procedures (including critical care time)  Medications Ordered in ED Medications  sodium chloride 0.9 % bolus 1,000 mL (0 mLs Intravenous Stopped 12/11/17 0238)  iopamidol (ISOVUE-370) 76 % injection 100 mL (100 mLs Intravenous Contrast Given 12/11/17 0345)     Initial Impression / Assessment and Plan / ED Course  I have reviewed the triage vital signs and the nursing notes.  Pertinent labs & imaging results that were available during  my care of the patient were reviewed by me and considered in my medical decision making (see chart for details).     Patient presents with multiple complaints.  I suspect that he is malingering, patient is homeless.  He has had multiple visits to the ER with various complaints.  He did complain of abdominal pain, nausea, vomiting, chest pain, shortness of breath.  He does have previous pathology and therefore work-up for all of these complaints was undertaken.  Abdominal exam is benign, no signs of acute surgical process.  Lab work unremarkable.  No leukocytosis.  Vital signs are normal.  Patient does have a previous history of thromboembolism.  He therefore underwent CT angiography which was negative.  EKG does not show ischemia or infarct.  Troponin negative x2.  At this point I feel patient has been adequately worked up for life-threatening condition he does not require any further intervention.  Final Clinical Impressions(s) / ED Diagnoses   Final diagnoses:  Chest  pain, unspecified type  Generalized abdominal pain  Nausea vomiting and diarrhea    ED Discharge Orders    None       Gilda Crease, MD 12/11/17 [phone]

## 2017-12-11 NOTE — ED Triage Notes (Signed)
Pt c/o chest pain, n/v/d that started this morning.

## 2017-12-13 ENCOUNTER — Other Ambulatory Visit: Payer: Self-pay

## 2017-12-13 ENCOUNTER — Emergency Department (HOSPITAL_COMMUNITY)
Admission: EM | Admit: 2017-12-13 | Discharge: 2017-12-13 | Disposition: A | Discharge status: Home / Self Care | Payer: Medicaid Other | Attending: Emergency Medicine | Admitting: Emergency Medicine

## 2017-12-13 ENCOUNTER — Emergency Department (HOSPITAL_COMMUNITY)
Admission: EM | Admit: 2017-12-13 | Discharge: 2017-12-14 | Disposition: A | Discharge status: Home / Self Care | Payer: Self-pay | Attending: Emergency Medicine | Admitting: Emergency Medicine

## 2017-12-13 DIAGNOSIS — Z5329 Procedure and treatment not carried out because of patient's decision for other reasons: Secondary | ICD-10-CM | POA: Insufficient documentation

## 2017-12-13 DIAGNOSIS — F10920 Alcohol use, unspecified with intoxication, uncomplicated: Secondary | ICD-10-CM | POA: Insufficient documentation

## 2017-12-13 DIAGNOSIS — R197 Diarrhea, unspecified: Secondary | ICD-10-CM | POA: Insufficient documentation

## 2017-12-13 DIAGNOSIS — F17200 Nicotine dependence, unspecified, uncomplicated: Secondary | ICD-10-CM | POA: Insufficient documentation

## 2017-12-13 DIAGNOSIS — R1110 Vomiting, unspecified: Secondary | ICD-10-CM | POA: Insufficient documentation

## 2017-12-13 DIAGNOSIS — Z5321 Procedure and treatment not carried out due to patient leaving prior to being seen by health care provider: Secondary | ICD-10-CM | POA: Insufficient documentation

## 2017-12-13 LAB — CBC WITH DIFFERENTIAL/PLATELET
Basophils Absolute: 0 10*3/uL (ref 0.0–0.1)
Basophils Relative: 0 %
Eosinophils Absolute: 0.1 10*3/uL (ref 0.0–0.7)
Eosinophils Relative: 1 %
HCT: 33.8 % — ABNORMAL LOW (ref 39.0–52.0)
Hemoglobin: 12 g/dL — ABNORMAL LOW (ref 13.0–17.0)
Lymphocytes Relative: 28 %
Lymphs Abs: 1 10*3/uL (ref 0.7–4.0)
MCH: 38.3 pg — ABNORMAL HIGH (ref 26.0–34.0)
MCHC: 35.5 g/dL (ref 30.0–36.0)
MCV: 108 fL — ABNORMAL HIGH (ref 78.0–100.0)
Monocytes Absolute: 0.3 10*3/uL (ref 0.1–1.0)
Monocytes Relative: 7 %
Neutro Abs: 2.3 10*3/uL (ref 1.7–7.7)
Neutrophils Relative %: 64 %
Platelets: 77 10*3/uL — ABNORMAL LOW (ref 150–400)
RBC: 3.13 MIL/uL — ABNORMAL LOW (ref 4.22–5.81)
RDW: 14.4 % (ref 11.5–15.5)
WBC: 3.6 10*3/uL — ABNORMAL LOW (ref 4.0–10.5)

## 2017-12-13 LAB — COMPREHENSIVE METABOLIC PANEL
ALT: 86 U/L — ABNORMAL HIGH (ref 17–63)
AST: 177 U/L — ABNORMAL HIGH (ref 15–41)
Albumin: 4 g/dL (ref 3.5–5.0)
Alkaline Phosphatase: 69 U/L (ref 38–126)
Anion gap: 10 (ref 5–15)
BUN: 5 mg/dL — ABNORMAL LOW (ref 6–20)
CO2: 25 mmol/L (ref 22–32)
Calcium: 8.2 mg/dL — ABNORMAL LOW (ref 8.9–10.3)
Chloride: 94 mmol/L — ABNORMAL LOW (ref 101–111)
Creatinine, Ser: 0.46 mg/dL — ABNORMAL LOW (ref 0.61–1.24)
GFR calc Af Amer: >60 mL/min (ref >60)
GFR calc non Af Amer: >60 mL/min (ref >60)
Glucose, Bld: 88 mg/dL (ref 65–99)
Potassium: 3.9 mmol/L (ref 3.5–5.1)
Sodium: 129 mmol/L — ABNORMAL LOW (ref 135–145)
Total Bilirubin: 0.7 mg/dL (ref 0.3–1.2)
Total Protein: 6.6 g/dL (ref 6.5–8.1)

## 2017-12-13 LAB — ETHANOL: Alcohol, Ethyl (B): 377 mg/dL (ref <10)

## 2017-12-13 MED ORDER — SODIUM CHLORIDE 0.9 % IV BOLUS
1000.0000 mL | Freq: Once | INTRAVENOUS | Status: AC
  Administered 2017-12-13: 1000 mL via INTRAVENOUS

## 2017-12-13 NOTE — ED Triage Notes (Signed)
EMS reports pt is homeless and they were called out to [facility] today for c/o chest pain.  Reports has been drinking etoh and has had vomiting and diarrhea "all day."  EMS gave  zofran IV.  Asked pt how much he had drank today and he replied "too much."  EMS says pt interested in detox program to help him stop drinking.  Denies SI and HI.  PT told ems his chest was sore to touch.

## 2017-12-13 NOTE — ED Notes (Signed)
CRITICAL VALUE ALERT  Critical Value: etoh 377  Date & Time Notied:   12/13/2017, 1711  Provider Notified: Dr. Estell Harpin  Orders Received/Actions taken: see chart

## 2017-12-13 NOTE — ED Notes (Signed)
Pt informed he was to be moved into [place] [address]  Pt refused and stated he was leaving He is encouraged to remain, move to hall 8 that we might accomodates other patients When he refused, he is encouraged to follow with [facility] for outpatient referral for detox

## 2017-12-14 ENCOUNTER — Encounter (HOSPITAL_COMMUNITY): Payer: Self-pay | Admitting: *Deleted

## 2017-12-14 ENCOUNTER — Other Ambulatory Visit: Payer: Self-pay

## 2017-12-14 ENCOUNTER — Emergency Department (HOSPITAL_COMMUNITY)
Admission: EM | Admit: 2017-12-14 | Discharge: 2017-12-14 | Disposition: A | Discharge status: Home / Self Care | Payer: Self-pay | Attending: Emergency Medicine | Admitting: Emergency Medicine

## 2017-12-14 DIAGNOSIS — F17200 Nicotine dependence, unspecified, uncomplicated: Secondary | ICD-10-CM | POA: Insufficient documentation

## 2017-12-14 DIAGNOSIS — R112 Nausea with vomiting, unspecified: Secondary | ICD-10-CM | POA: Insufficient documentation

## 2017-12-14 DIAGNOSIS — F1010 Alcohol abuse, uncomplicated: Secondary | ICD-10-CM | POA: Insufficient documentation

## 2017-12-14 DIAGNOSIS — I2510 Atherosclerotic heart disease of native coronary artery without angina pectoris: Secondary | ICD-10-CM | POA: Insufficient documentation

## 2017-12-14 LAB — I-STAT CHEM 8, ED
BUN: <3 mg/dL — ABNORMAL LOW (ref 6–20)
Calcium, Ion: 1.02 mmol/L — ABNORMAL LOW (ref 1.15–1.40)
Chloride: 97 mmol/L — ABNORMAL LOW (ref 101–111)
Creatinine, Ser: 0.8 mg/dL (ref 0.61–1.24)
Glucose, Bld: 74 mg/dL (ref 65–99)
HCT: 38 % — ABNORMAL LOW (ref 39.0–52.0)
Hemoglobin: 12.9 g/dL — ABNORMAL LOW (ref 13.0–17.0)
Potassium: 4.1 mmol/L (ref 3.5–5.1)
Sodium: 136 mmol/L (ref 135–145)
TCO2: 27 mmol/L (ref 22–32)

## 2017-12-14 MED ORDER — SODIUM CHLORIDE 0.9 % IV BOLUS
1000.0000 mL | Freq: Once | INTRAVENOUS | Status: AC
  Administered 2017-12-14: 1000 mL via INTRAVENOUS

## 2017-12-14 MED ORDER — PANTOPRAZOLE SODIUM 40 MG PO TBEC
40.0000 mg | DELAYED_RELEASE_TABLET | Freq: Once | ORAL | Status: AC
  Administered 2017-12-14: 40 mg via ORAL
  Filled 2017-12-14: qty 1

## 2017-12-14 MED ORDER — ONDANSETRON 4 MG PO TBDP: 4.0000 mg | ORAL_TABLET | Freq: Three times a day (TID) | ORAL | 0 refills | Status: DC | PRN

## 2017-12-14 MED ORDER — OMEPRAZOLE 20 MG PO CPDR: 20.0000 mg | DELAYED_RELEASE_CAPSULE | Freq: Every day | ORAL | 0 refills | Status: DC

## 2017-12-14 MED ORDER — ONDANSETRON HCL 4 MG/2ML IJ SOLN
4.0000 mg | Freq: Once | INTRAMUSCULAR | Status: AC
  Administered 2017-12-14: 4 mg via INTRAVENOUS
  Filled 2017-12-14: qty 2

## 2017-12-14 NOTE — ED Triage Notes (Signed)
Pt c/o abdominal pain and diarrhea; pt has been here x 2 times for same complaint yesterday

## 2017-12-14 NOTE — ED Triage Notes (Signed)
Pt c/o n/vd all day

## 2017-12-14 NOTE — ED Notes (Signed)
Pt declined instructions for discharge paperwork.

## 2017-12-14 NOTE — ED Notes (Signed)
Currently waiting for pt's fluids to finish, then will DC

## 2017-12-14 NOTE — ED Provider Notes (Signed)
[facility] EMERGENCY DEPARTMENT Provider Note   CSN: [id number] Arrival date & time: 12/14/17  4098     History   Chief Complaint Chief Complaint  Patient presents with  . Abdominal Pain    HPI EMMANUELL Norton is a 47 y.o. male.  HPI  This a 47 year old male with a history of coronary artery disease who presents with nausea, vomiting, and abdominal pain.  Patient reports several day history of the same.  He reports being seen and evaluated but "it continues."  He continues to drink alcohol daily.  He has not tried anything for his symptoms.  He reports upper abdominal pain that is nonradiating.  He denies any recent fevers.  Denies any blood in his emesis or stool.  Of note, he was seen on 5/18 with similar symptoms.  His work-up at that time was benign.  He presented yesterday but left without being seen.  Lab work reviewed and shows slight hyponatremia with a sodium of 129.  Alcohol level persistently elevated greater than 300.  Patient is currently homeless.  Past Medical History:  Diagnosis Date  . Alcohol withdrawal (HCC) 12/07/2017   pt states he "goes into DT's"  . Coronary artery disease   . DVT (deep venous thrombosis) (HCC)     There are no active problems to display for this patient.   Past Surgical History:  Procedure Laterality Date  . CARDIAC CATHETERIZATION          Home Medications    Prior to Admission medications   Medication Sig Start Date End Date Taking? Authorizing Provider  acetaminophen (TYLENOL) 325 MG tablet Take 650 mg by mouth every 6 (six) hours as needed for mild pain or headache.    [provider]  omeprazole (PRILOSEC) 20 MG capsule Take 1 capsule (20 mg total) by mouth daily. 12/14/17   Horton, Mayer Masker, MD  ondansetron (ZOFRAN ODT) 4 MG disintegrating tablet Take 1 tablet (4 mg total) by mouth every 8 (eight) hours as needed for nausea or vomiting. 12/14/17   Horton, Mayer Masker, MD    Family History History reviewed. No  pertinent family history.  Social History Social History   Tobacco Use  . Smoking status: Current Every Day Smoker    Packs/day: 1.00  . Smokeless tobacco: Never Used  Substance Use Topics  . Alcohol use: Yes    Alcohol/week: 21.6 oz    Types: 36 Cans of beer per week  . Drug use: No     Allergies   Lac bovis; Penicillins; and Hydrocodone-acetaminophen   Review of Systems Review of Systems  Constitutional: Negative for fever.  Respiratory: Negative for shortness of breath.   Cardiovascular: Negative for chest pain.  Gastrointestinal: Positive for abdominal pain, diarrhea, nausea and vomiting. Negative for constipation.  Genitourinary: Negative for dysuria.  All other systems reviewed and are negative.    Physical Exam Updated Vital Signs BP 118/69 (BP Location: Right Arm)   Pulse 74   Temp 97.9 F (36.6 C) (Oral)   Resp 18   Ht  (1.803 m)   Wt 68 kg (150 lb)   SpO2 100%   BMI 20.92 kg/m   Physical Exam  Constitutional: He is oriented to person, place, and time.  Disheveled appearing but nontoxic, no acute distress  HENT:  Head: Normocephalic and atraumatic.  Neck: Neck supple.  Cardiovascular: Normal rate, regular rhythm and normal heart sounds.  No murmur heard. Pulmonary/Chest: Effort normal and breath sounds normal. No respiratory  distress. He has no wheezes.  Abdominal: Soft. Normal appearance and bowel sounds are normal. There is no tenderness. There is no rebound and no guarding.  Musculoskeletal: He exhibits no edema.  Lymphadenopathy:    He has no cervical adenopathy.  Neurological: He is alert and oriented to person, place, and time.  Skin: Skin is warm and dry.  Psychiatric: He has a normal mood and affect.  Nursing note and vitals reviewed.    ED Treatments / Results  Labs (all labs ordered are listed, but only abnormal results are displayed) Labs Reviewed  I-STAT CHEM 8, ED - Abnormal; Notable for the following components:       Result Value   Chloride 97 (*)    BUN <3 (*)    Calcium, Ion 1.02 (*)    Hemoglobin 12.9 (*)    HCT 38.0 (*)    All other components within normal limits    EKG None  Radiology No results found.  Procedures Procedures (including critical care time)  Medications Ordered in ED Medications  sodium chloride 0.9 % bolus 1,000 mL (1,000 mLs Intravenous New Bag/Given 12/14/17 0602)  ondansetron (ZOFRAN) injection 4 mg (4 mg Intravenous Given 12/14/17 0609)  pantoprazole (PROTONIX) EC tablet 40 mg (40 mg Oral Given 12/14/17 0609)     Initial Impression / Assessment and Plan / ED Course  I have reviewed the triage vital signs and the nursing notes.  Pertinent labs & imaging results that were available during my care of the patient were reviewed by me and considered in my medical decision making (see chart for details).     Patient again presents with nausea, vomiting, diarrhea.  He is overall nontoxic-appearing and vital signs are within normal limits.  His exam is completely benign.  No objective tenderness on exam.  I have reviewed his lab work from 4 PM yesterday approximately 12 hours ago.  Sodium 129, chloride 94.  AST 177, ALT 86.  Anion gap 10.  Ethanol level 377.  Repeat Chem-8 with improved sodium.  Patient was given fluids and Protonix.  Given benign exam, have low suspicion for surgical process including cholecystitis.  Lipase was within normal limits 2 days ago.  Patient does continue to drink.  Suspect gastritis versus gastroenteritis.  Given benign exam, will not investigate further.  He is tolerating fluids without difficulty.  I encouraged the patient to stop drinking.  Follow-up with GI provided.  Will start on omeprazole daily.  Patient was discharged with Zofran.  After history, exam, and medical workup I feel the patient has been appropriately medically screened and is safe for discharge home. Pertinent diagnoses were discussed with the patient. Patient was given return  precautions.   Final Clinical Impressions(s) / ED Diagnoses   Final diagnoses:  Non-intractable vomiting with nausea, unspecified vomiting type  Alcohol abuse    ED Discharge Orders        Ordered    omeprazole (PRILOSEC) 20 MG capsule  Daily     12/14/17 0610    ondansetron (ZOFRAN ODT) 4 MG disintegrating tablet  Every 8 hours PRN     12/14/17 0610       Shon Baton, MD 12/14/17 [phone]

## 2017-12-14 NOTE — Discharge Instructions (Addendum)
Your seen today for continued nausea, vomiting, abdominal pain.  This is likely related to your alcohol use.  You need to decrease consumption.  Take omeprazole daily.  Follow-up with GI if symptoms continue.

## 2017-12-16 NOTE — ED Provider Notes (Signed)
[facility] EMERGENCY DEPARTMENT Provider Note   CSN: [id number] Arrival date & time: 12/13/17  1537     History   Chief Complaint Chief Complaint  Patient presents with  . Alcohol Intoxication    HPI Derek Norton is a 47 y.o. male.  Patient presents to the emergency department intoxicated with a history of vomiting.  The history is provided by the patient. No language interpreter was used.  Alcohol Intoxication  This is a chronic problem. The current episode started 12 to 24 hours ago. The problem occurs constantly. Pertinent negatives include no chest pain, no abdominal pain and no headaches. Nothing aggravates the symptoms. Nothing relieves the symptoms.    Past Medical History:  Diagnosis Date  . Alcohol withdrawal (HCC) 12/07/2017   pt states he "goes into DT's"  . Coronary artery disease   . DVT (deep venous thrombosis) (HCC)     There are no active problems to display for this patient.   Past Surgical History:  Procedure Laterality Date  . CARDIAC CATHETERIZATION          Home Medications    Prior to Admission medications   Medication Sig Start Date End Date Taking? Authorizing Provider  acetaminophen (TYLENOL) 325 MG tablet Take 650 mg by mouth every 6 (six) hours as needed for mild pain or headache.   Yes [provider]  omeprazole (PRILOSEC) 20 MG capsule Take 1 capsule (20 mg total) by mouth daily. 12/14/17   Horton, Mayer Masker, MD  ondansetron (ZOFRAN ODT) 4 MG disintegrating tablet Take 1 tablet (4 mg total) by mouth every 8 (eight) hours as needed for nausea or vomiting. 12/14/17   Horton, Mayer Masker, MD    Family History No family history on file.  Social History Social History   Tobacco Use  . Smoking status: Current Every Day Smoker    Packs/day: 1.00  . Smokeless tobacco: Never Used  Substance Use Topics  . Alcohol use: Yes    Alcohol/week: 21.6 oz    Types: 36 Cans of beer per week  . Drug use: No     Allergies     Lac bovis; Penicillins; and Hydrocodone-acetaminophen   Review of Systems Review of Systems  Constitutional: Negative for appetite change and fatigue.  HENT: Negative for congestion, ear discharge and sinus pressure.   Eyes: Negative for discharge.  Respiratory: Negative for cough.   Cardiovascular: Negative for chest pain.  Gastrointestinal: Negative for abdominal pain and diarrhea.  Genitourinary: Negative for frequency and hematuria.  Musculoskeletal: Negative for back pain.  Skin: Negative for rash.  Neurological: Negative for seizures and headaches.  Psychiatric/Behavioral: Negative for hallucinations.     Physical Exam Updated Vital Signs BP 103/89   Pulse 97   Temp 98.8 F (37.1 C) (Oral)   Resp 14   Wt 68 kg (150 lb)   SpO2 98%   BMI 20.92 kg/m   Physical Exam  Constitutional: He is oriented to person, place, and time. He appears well-developed.  HENT:  Head: Normocephalic.  Eyes: Conjunctivae and EOM are normal. No scleral icterus.  Neck: Neck supple. No thyromegaly present.  Cardiovascular: Normal rate and regular rhythm. Exam reveals no gallop and no friction rub.  No murmur heard. Pulmonary/Chest: No stridor. He has no wheezes. He has no rales. He exhibits no tenderness.  Abdominal: He exhibits no distension. There is no tenderness. There is no rebound.  Musculoskeletal: Normal range of motion. He exhibits no edema.  Lymphadenopathy:  He has no cervical adenopathy.  Neurological: He is oriented to person, place, and time. He exhibits normal muscle tone. Coordination normal.  Patient moderately inebriated  Skin: No rash noted. No erythema.  Psychiatric: He has a normal mood and affect. His behavior is normal.     ED Treatments / Results  Labs (all labs ordered are listed, but only abnormal results are displayed) Labs Reviewed  CBC WITH DIFFERENTIAL/PLATELET - Abnormal; Notable for the following components:      Result Value   WBC 3.6 (*)    RBC  3.13 (*)    Hemoglobin 12.0 (*)    HCT 33.8 (*)    MCV 108.0 (*)    MCH 38.3 (*)    Platelets 77 (*)    All other components within normal limits  COMPREHENSIVE METABOLIC PANEL - Abnormal; Notable for the following components:   Sodium 129 (*)    Chloride 94 (*)    BUN 5 (*)    Creatinine, Ser 0.46 (*)    Calcium 8.2 (*)    AST 177 (*)    ALT 86 (*)    All other components within normal limits  ETHANOL - Abnormal; Notable for the following components:   Alcohol, Ethyl (B) 377 (*)    All other components within normal limits    EKG None  Radiology No results found.  Procedures Procedures (including critical care time)  Medications Ordered in ED Medications  sodium chloride 0.9 % bolus 1,000 mL (0 mLs Intravenous Stopped 12/13/17 1645)     Initial Impression / Assessment and Plan / ED Course  I have reviewed the triage vital signs and the nursing notes.  Pertinent labs & imaging results that were available during my care of the patient were reviewed by me and considered in my medical decision making (see chart for details).    Labs show patient's alcohol level 377.  Patient was given IV fluids and hydrated.  Patient then decided to leave [facility]  Final Clinical Impressions(s) / ED Diagnoses   Final diagnoses:  Alcoholic intoxication without complication [facility])    ED Discharge Orders    None       Bethann Berkshire, MD 12/16/17 [phone]

## 2018-04-28 ENCOUNTER — Emergency Department (HOSPITAL_COMMUNITY)
Admission: EM | Admit: 2018-04-28 | Discharge: 2018-04-29 | Discharge status: Against Medical Advice | Payer: Self-pay | Attending: Emergency Medicine | Admitting: Emergency Medicine

## 2018-04-28 ENCOUNTER — Encounter (HOSPITAL_COMMUNITY): Payer: Self-pay

## 2018-04-28 ENCOUNTER — Emergency Department (HOSPITAL_COMMUNITY)
Admission: EM | Admit: 2018-04-28 | Discharge: 2018-04-28 | Disposition: A | Discharge status: Home / Self Care | Payer: Self-pay | Attending: Emergency Medicine | Admitting: Emergency Medicine

## 2018-04-28 ENCOUNTER — Encounter (HOSPITAL_COMMUNITY): Payer: Self-pay | Admitting: *Deleted

## 2018-04-28 ENCOUNTER — Other Ambulatory Visit: Payer: Self-pay

## 2018-04-28 DIAGNOSIS — Z5321 Procedure and treatment not carried out due to patient leaving prior to being seen by health care provider: Secondary | ICD-10-CM | POA: Insufficient documentation

## 2018-04-28 DIAGNOSIS — F17200 Nicotine dependence, unspecified, uncomplicated: Secondary | ICD-10-CM | POA: Insufficient documentation

## 2018-04-28 DIAGNOSIS — Z590 Homelessness: Secondary | ICD-10-CM | POA: Insufficient documentation

## 2018-04-28 DIAGNOSIS — Z79899 Other long term (current) drug therapy: Secondary | ICD-10-CM | POA: Insufficient documentation

## 2018-04-28 DIAGNOSIS — F10129 Alcohol abuse with intoxication, unspecified: Secondary | ICD-10-CM | POA: Insufficient documentation

## 2018-04-28 DIAGNOSIS — I2510 Atherosclerotic heart disease of native coronary artery without angina pectoris: Secondary | ICD-10-CM | POA: Insufficient documentation

## 2018-04-28 DIAGNOSIS — F10920 Alcohol use, unspecified with intoxication, uncomplicated: Secondary | ICD-10-CM

## 2018-04-28 MED ORDER — ERYTHROMYCIN 5 MG/GM OP OINT: TOPICAL_OINTMENT | Freq: Once | OPHTHALMIC | Status: DC

## 2018-04-28 NOTE — ED Provider Notes (Addendum)
[facility]-EMERGENCY DEPT Provider Note   CSN: [id number] Arrival date & time: 04/28/18  1310     History   Chief Complaint Chief Complaint  Patient presents with  . Alcohol Intoxication    HPI Derek ODONELL is a 47 y.o. male with history of CAD, alcohol abuse who presents with alcohol intoxication.  Patient was found stumbling around [facility].  He is homeless.  He complains of dehydration, but admits that he drinks too much.  He admits he drinks at least 8, 24 ounce beers daily.  He states he just wants to take a nap.  He reports he vomits when he drinks, but this is not new.  He denies any pain at this time.  He denies falling.  HPI  Past Medical History:  Diagnosis Date  . Alcohol withdrawal (HCC) 12/07/2017   pt states he "goes into DT's"  . Coronary artery disease   . DVT (deep venous thrombosis) (HCC)     There are no active problems to display for this patient.   Past Surgical History:  Procedure Laterality Date  . CARDIAC CATHETERIZATION          Home Medications    Prior to Admission medications   Medication Sig Start Date End Date Taking? Authorizing Provider  acetaminophen (TYLENOL) 325 MG tablet Take 650 mg by mouth every 6 (six) hours as needed for mild pain or headache.    [provider]  omeprazole (PRILOSEC) 20 MG capsule Take 1 capsule (20 mg total) by mouth daily. 12/14/17   Horton, Mayer Masker, MD  ondansetron (ZOFRAN ODT) 4 MG disintegrating tablet Take 1 tablet (4 mg total) by mouth every 8 (eight) hours as needed for nausea or vomiting. 12/14/17   Horton, Mayer Masker, MD    Family History History reviewed. No pertinent family history.  Social History Social History   Tobacco Use  . Smoking status: Current Every Day Smoker    Packs/day: 1.00  . Smokeless tobacco: Never Used  Substance Use Topics  . Alcohol use: Yes    Alcohol/week: 36.0 standard drinks    Types: 36 Cans of beer per week  . Drug use: No       Allergies   Lac bovis; Penicillins; and Hydrocodone-acetaminophen   Review of Systems Review of Systems  Constitutional: Negative for chills and fever.  HENT: Negative for facial swelling and sore throat.   Respiratory: Negative for shortness of breath.   Cardiovascular: Negative for chest pain.  Gastrointestinal: Positive for vomiting (when he drinks). Negative for abdominal pain and nausea.  Genitourinary: Negative for dysuria.  Musculoskeletal: Negative for back pain.  Skin: Negative for rash and wound.  Neurological: Negative for headaches.  Psychiatric/Behavioral: The patient is not nervous/anxious.      Physical Exam Updated Vital Signs BP 113/74   Pulse 80   Temp 98.1 F (36.7 C) (Oral)   Resp 16   Ht 5\' 5"  (1.651 m)   Wt 74.8 kg   SpO2 94%   BMI 27.46 kg/m   Physical Exam  Constitutional: He appears well-developed and well-nourished. No distress.  HENT:  Head: Normocephalic and atraumatic.  Mouth/Throat: Oropharynx is clear and moist. No oropharyngeal exudate.  Reeks of alcohol  Eyes: Pupils are equal, round, and reactive to light. Conjunctivae and EOM are normal. Right eye exhibits no discharge. Left eye exhibits no discharge. No scleral icterus.  Purulent discharge coming from the left eye, no injected conjunctiva or surrounding erythema  Neck:  Normal range of motion. Neck supple. No thyromegaly present.  Cardiovascular: Normal rate, regular rhythm, normal heart sounds and intact distal pulses. Exam reveals no gallop and no friction rub.  No murmur heard. Pulmonary/Chest: Effort normal and breath sounds normal. No stridor. No respiratory distress. He has no wheezes. He has no rales.  Abdominal: Soft. Bowel sounds are normal. He exhibits no distension. There is no tenderness. There is no rebound and no guarding.  Musculoskeletal: He exhibits no edema.  Lymphadenopathy:    He has no cervical adenopathy.  Neurological: He is alert. Coordination normal.   Skin: Skin is warm and dry. No rash noted. He is not diaphoretic. No pallor.  Psychiatric: He has a normal mood and affect.  Nursing note and vitals reviewed.    ED Treatments / Results  Labs (all labs ordered are listed, but only abnormal results are displayed) Labs Reviewed - No data to display  EKG None  Radiology No results found.  Procedures Procedures (including critical care time)  Medications Ordered in ED Medications  erythromycin ophthalmic ointment (has no administration in time range)     Initial Impression / Assessment and Plan / ED Course  I have reviewed the triage vital signs and the nursing notes.  Pertinent labs & imaging results that were available during my care of the patient were reviewed by me and considered in my medical decision making (see chart for details).  Clinical Course as of Apr 29 1627  Thu Apr 28, 2018  1416 After my evaluation of the patient, I walked away from the hall bed and patient urinated in his bed and it dripped down off the bed to make a puddle on the floor.   [AL]    Clinical Course User Index [AL] Emi Holes, PA-C    Patient very intoxicated.  He reports he just went to sleep and have a snack. He denies falling or hitting his head.  He urinated on himself and refuses to get up to get changed. At shift change, patient care transferred to [facility], PA-C for continued evaluation, monitoring until metabolized, and determination of disposition.  Plan discharge home with erythromycin ointment for suspected bacterial conjunctivitis.  Final Clinical Impressions(s) / ED Diagnoses   Final diagnoses:  Alcoholic intoxication without complication [facility])    ED Discharge Orders    None          Emi Holes, PA-C 04/28/18 1628    Alvira Monday, MD 04/29/18 2205

## 2018-04-28 NOTE — ED Triage Notes (Signed)
EMS reports from [employer] (Homeless) Pt called c/o "dehydration", Pt admits ETOH abuse and states he drank "too much". Later admitted he drank 8-24oz Beers.  BP 110/74 HR 80 RR 18 Sp02 98 RA CBG 91

## 2018-04-28 NOTE — ED Notes (Signed)
Bed: [facility] Expected date:  Expected time:  Means of arrival:  Comments: EMS/ETOH 

## 2018-04-28 NOTE — ED Triage Notes (Signed)
Pt was seen here earlier and discharged, he walked to the [employer] and asked the police to take him to the shelter, one was full and they tried another one, then he wanted to go to a [facility] hospital so EMS was called Pt has no medical complaint

## 2018-04-29 ENCOUNTER — Emergency Department (HOSPITAL_COMMUNITY)
Admission: EM | Admit: 2018-04-29 | Discharge: 2018-04-29 | Discharge status: Against Medical Advice | Payer: Medicaid Other

## 2018-04-29 ENCOUNTER — Encounter (HOSPITAL_COMMUNITY): Payer: Self-pay | Admitting: Emergency Medicine

## 2018-04-29 ENCOUNTER — Emergency Department (HOSPITAL_COMMUNITY)
Admission: EM | Admit: 2018-04-29 | Discharge: 2018-04-29 | Disposition: A | Discharge status: Home / Self Care | Payer: Medicaid Other | Attending: Emergency Medicine | Admitting: Emergency Medicine

## 2018-04-29 DIAGNOSIS — F10920 Alcohol use, unspecified with intoxication, uncomplicated: Secondary | ICD-10-CM

## 2018-04-29 DIAGNOSIS — F17210 Nicotine dependence, cigarettes, uncomplicated: Secondary | ICD-10-CM | POA: Insufficient documentation

## 2018-04-29 DIAGNOSIS — Z79899 Other long term (current) drug therapy: Secondary | ICD-10-CM | POA: Insufficient documentation

## 2018-04-29 DIAGNOSIS — F10220 Alcohol dependence with intoxication, uncomplicated: Secondary | ICD-10-CM | POA: Insufficient documentation

## 2018-04-29 DIAGNOSIS — Z5900 Homelessness unspecified: Secondary | ICD-10-CM

## 2018-04-29 DIAGNOSIS — I2510 Atherosclerotic heart disease of native coronary artery without angina pectoris: Secondary | ICD-10-CM | POA: Insufficient documentation

## 2018-04-29 NOTE — ED Notes (Signed)
Bed: [facility] Expected date:  Expected time:  Means of arrival:  Comments: 

## 2018-04-29 NOTE — ED Provider Notes (Signed)
[facility]-EMERGENCY DEPT Provider Note   CSN: [id number] Arrival date & time: 04/29/18  1607     History   Chief Complaint Chief Complaint  Patient presents with  . Alcohol Intoxication    HPI Derek Norton is a 47 y.o. male.  Pt presents to the ED today with wanting fluids.  The pt has a hx of alcohol intoxication and has been to the ED multiple times wanting fluids and shelter.  The pt just left [facility] this afternoon.  The pt said he needs IV fluids, but has been able to drink alcohol without problems.  No pain.     Past Medical History:  Diagnosis Date  . Alcohol withdrawal (HCC) 12/07/2017   pt states he "goes into DT's"  . Coronary artery disease   . DVT (deep venous thrombosis) (HCC)     There are no active problems to display for this patient.   Past Surgical History:  Procedure Laterality Date  . CARDIAC CATHETERIZATION          Home Medications    Prior to Admission medications   Medication Sig Start Date End Date Taking? Authorizing Provider  acetaminophen (TYLENOL) 325 MG tablet Take 650 mg by mouth every 6 (six) hours as needed for mild pain or headache.    [provider]  omeprazole (PRILOSEC) 20 MG capsule Take 1 capsule (20 mg total) by mouth daily. 12/14/17   Horton, Mayer Masker, MD  ondansetron (ZOFRAN ODT) 4 MG disintegrating tablet Take 1 tablet (4 mg total) by mouth every 8 (eight) hours as needed for nausea or vomiting. 12/14/17   Horton, Mayer Masker, MD    Family History No family history on file.  Social History Social History   Tobacco Use  . Smoking status: Current Every Day Smoker    Packs/day: 1.00  . Smokeless tobacco: Never Used  Substance Use Topics  . Alcohol use: Yes    Alcohol/week: 36.0 standard drinks    Types: 36 Cans of beer per week  . Drug use: No     Allergies   Lac bovis; Penicillins; and Hydrocodone-acetaminophen   Review of Systems Review of Systems  All other systems  reviewed and are negative.    Physical Exam Updated Vital Signs BP 112/73 (BP Location: Right Arm)   Pulse 68   Resp 20   SpO2 100%   Physical Exam  Constitutional: He is oriented to person, place, and time. He appears well-developed and well-nourished.  HENT:  Head: Normocephalic and atraumatic.  Right Ear: External ear normal.  Left Ear: External ear normal.  Nose: Nose normal.  Mouth/Throat: Oropharynx is clear and moist.  Eyes: Pupils are equal, round, and reactive to light. Conjunctivae and EOM are normal.  Neck: Normal range of motion. Neck supple.  Cardiovascular: Normal rate, regular rhythm, normal heart sounds and intact distal pulses.  Pulmonary/Chest: Effort normal and breath sounds normal.  Abdominal: Soft. Bowel sounds are normal.  Musculoskeletal: Normal range of motion.  Neurological: He is alert and oriented to person, place, and time.  Skin: Skin is warm. Capillary refill takes less than 2 seconds.  Psychiatric: He has a normal mood and affect. His behavior is normal. Judgment and thought content normal.  Nursing note and vitals reviewed.    ED Treatments / Results  Labs (all labs ordered are listed, but only abnormal results are displayed) Labs Reviewed - No data to display  EKG None  Radiology No results found.  Procedures Procedures (  including critical care time)  Medications Ordered in ED Medications - No data to display   Initial Impression / Assessment and Plan / ED Course  I have reviewed the triage vital signs and the nursing notes.  Pertinent labs & imaging results that were available during my care of the patient were reviewed by me and considered in my medical decision making (see chart for details).    Vitals are ok.  Pt is able to tolerate po fluids.  Pt given a resource guide on shelters.  He knows to return if worse.  Final Clinical Impressions(s) / ED Diagnoses   Final diagnoses:  Alcoholic intoxication without complication  [facility])  Homeless    ED Discharge Orders    None       Jacalyn Lefevre, MD 04/29/18 [phone]

## 2018-04-29 NOTE — ED Triage Notes (Signed)
Pt was brought to triage via stretcher with c/o chest pain.  Pt began to curse and reports he is "getting the fuck out of here, all I want is a cold beer".  Pt ambulated out into lobby.

## 2018-04-29 NOTE — ED Notes (Signed)
Pt went to bathroom in lobby. Pt was in bathroom for 20 mins. Knocked on door to ask if pt was okay and pt stated "leave me alone, I am taking a shit". When pt left bathroom he stated "I am going to the store to get a beer".

## 2018-04-29 NOTE — ED Triage Notes (Signed)
Patient here via EMS with ETOH. Seen at [facility] earlier today.

## 2018-04-30 ENCOUNTER — Encounter (HOSPITAL_COMMUNITY): Payer: Self-pay

## 2018-04-30 ENCOUNTER — Other Ambulatory Visit: Payer: Self-pay

## 2018-04-30 ENCOUNTER — Emergency Department (HOSPITAL_COMMUNITY)
Admission: EM | Admit: 2018-04-30 | Discharge: 2018-05-01 | Disposition: A | Discharge status: Home / Self Care | Payer: Medicaid Other | Attending: Emergency Medicine | Admitting: Emergency Medicine

## 2018-04-30 DIAGNOSIS — F17200 Nicotine dependence, unspecified, uncomplicated: Secondary | ICD-10-CM | POA: Insufficient documentation

## 2018-04-30 DIAGNOSIS — R079 Chest pain, unspecified: Secondary | ICD-10-CM

## 2018-04-30 DIAGNOSIS — R197 Diarrhea, unspecified: Secondary | ICD-10-CM | POA: Insufficient documentation

## 2018-04-30 DIAGNOSIS — R110 Nausea: Secondary | ICD-10-CM

## 2018-04-30 DIAGNOSIS — Z79899 Other long term (current) drug therapy: Secondary | ICD-10-CM | POA: Insufficient documentation

## 2018-04-30 DIAGNOSIS — R112 Nausea with vomiting, unspecified: Secondary | ICD-10-CM | POA: Insufficient documentation

## 2018-04-30 DIAGNOSIS — I2510 Atherosclerotic heart disease of native coronary artery without angina pectoris: Secondary | ICD-10-CM | POA: Insufficient documentation

## 2018-04-30 LAB — URINALYSIS, ROUTINE W REFLEX MICROSCOPIC
Bilirubin Urine: NEGATIVE
Glucose, UA: NEGATIVE mg/dL
Hgb urine dipstick: NEGATIVE
Ketones, ur: NEGATIVE mg/dL
Leukocytes, UA: NEGATIVE
Nitrite: NEGATIVE
Protein, ur: NEGATIVE mg/dL
Specific Gravity, Urine: 1.002 — ABNORMAL LOW (ref 1.005–1.030)
pH: 6 (ref 5.0–8.0)

## 2018-04-30 LAB — COMPREHENSIVE METABOLIC PANEL
ALT: 36 U/L (ref 0–44)
AST: 80 U/L — ABNORMAL HIGH (ref 15–41)
Albumin: 4 g/dL (ref 3.5–5.0)
Alkaline Phosphatase: 79 U/L (ref 38–126)
Anion gap: 14 (ref 5–15)
BUN: <5 mg/dL — ABNORMAL LOW (ref 6–20)
CO2: 22 mmol/L (ref 22–32)
Calcium: 8.8 mg/dL — ABNORMAL LOW (ref 8.9–10.3)
Chloride: 95 mmol/L — ABNORMAL LOW (ref 98–111)
Creatinine, Ser: 0.45 mg/dL — ABNORMAL LOW (ref 0.61–1.24)
GFR calc Af Amer: >60 mL/min (ref >60)
GFR calc non Af Amer: >60 mL/min (ref >60)
Glucose, Bld: 93 mg/dL (ref 70–99)
Potassium: 3.6 mmol/L (ref 3.5–5.1)
Sodium: 131 mmol/L — ABNORMAL LOW (ref 135–145)
Total Bilirubin: 0.8 mg/dL (ref 0.3–1.2)
Total Protein: 7.1 g/dL (ref 6.5–8.1)

## 2018-04-30 LAB — CBC
HCT: 34.8 % — ABNORMAL LOW (ref 39.0–52.0)
Hemoglobin: 12.4 g/dL — ABNORMAL LOW (ref 13.0–17.0)
MCH: 37.8 pg — ABNORMAL HIGH (ref 26.0–34.0)
MCHC: 35.6 g/dL (ref 30.0–36.0)
MCV: 106.1 fL — ABNORMAL HIGH (ref 78.0–100.0)
Platelets: 121 10*3/uL — ABNORMAL LOW (ref 150–400)
RBC: 3.28 MIL/uL — ABNORMAL LOW (ref 4.22–5.81)
RDW: 15.2 % (ref 11.5–15.5)
WBC: 3.4 10*3/uL — ABNORMAL LOW (ref 4.0–10.5)

## 2018-04-30 LAB — LIPASE, BLOOD: Lipase: 45 U/L (ref 11–51)

## 2018-04-30 LAB — POC OCCULT BLOOD, ED: Fecal Occult Bld: NEGATIVE

## 2018-04-30 LAB — I-STAT TROPONIN, ED: Troponin i, poc: 0 ng/mL (ref 0.00–0.08)

## 2018-04-30 MED ORDER — SODIUM CHLORIDE 0.9 % IV BOLUS
1000.0000 mL | Freq: Once | INTRAVENOUS | Status: AC
  Administered 2018-04-30: 1000 mL via INTRAVENOUS

## 2018-04-30 MED ORDER — SODIUM CHLORIDE 0.9 % IV SOLN
Freq: Once | INTRAVENOUS | Status: DC
Start: 2018-04-30 — End: 2018-05-01

## 2018-04-30 MED ORDER — FAMOTIDINE IN NACL 20-0.9 MG/50ML-% IV SOLN
20.0000 mg | Freq: Once | INTRAVENOUS | Status: AC
  Administered 2018-04-30: 20 mg via INTRAVENOUS
  Filled 2018-04-30: qty 50

## 2018-04-30 MED ORDER — DIPHENHYDRAMINE HCL 50 MG/ML IJ SOLN
25.0000 mg | Freq: Once | INTRAMUSCULAR | Status: AC
  Administered 2018-04-30: 25 mg via INTRAVENOUS
  Filled 2018-04-30: qty 1

## 2018-04-30 MED ORDER — METOCLOPRAMIDE HCL 5 MG/ML IJ SOLN
10.0000 mg | Freq: Once | INTRAMUSCULAR | Status: AC
  Administered 2018-04-30: 10 mg via INTRAVENOUS
  Filled 2018-04-30: qty 2

## 2018-04-30 NOTE — ED Notes (Signed)
Fluid/PO challenge ordered by provider. Pt was given sprite and crackers. Will reassess in apprx 15 mins.

## 2018-04-30 NOTE — ED Notes (Signed)
Bed: [facility] Expected date:  Expected time:  Means of arrival:  Comments: 

## 2018-04-30 NOTE — ED Provider Notes (Signed)
[facility]-EMERGENCY DEPT Provider Note   CSN: [id number] Arrival date & time: 04/30/18  1756     History   Chief Complaint Chief Complaint  Patient presents with  . Emesis    HPI Derek Norton is a 47 y.o. male.  HPI   Patient is a 47 year old male with a history of alcohol use, CAD, DVT, presenting for nausea, vomiting, diarrhea.  Patient also notes that he developed some chest pain while in the emergency department.  Patient reports that he has "more episodes and he can count" of nausea and vomiting today.  He describes it as "reddish and blackish".  He also reports that he has had some diarrhea today, does not know how many episodes, and thinks that it was "blackish" couple days ago.  Patient does report he has a history of PUD.  He denies any fevers, chills, focal abdominal pain, dysuria, urgency, frequency.  Patient is also noting that he is developed some left-sided, intermittent, and nonexertional chest pain.  No shortness of breath.  He describes it as the left anterior region of the chest, nonradiating.  Patient does have a history of CAD with stent.  Patient does not know how much she drank today, but states "more than I should".  Past Medical History:  Diagnosis Date  . Alcohol withdrawal (HCC) 12/07/2017   pt states he "goes into DT's"  . Coronary artery disease   . DVT (deep venous thrombosis) (HCC)     There are no active problems to display for this patient.   Past Surgical History:  Procedure Laterality Date  . CARDIAC CATHETERIZATION          Home Medications    Prior to Admission medications   Medication Sig Start Date End Date Taking? Authorizing Provider  acetaminophen (TYLENOL) 325 MG tablet Take 650 mg by mouth every 6 (six) hours as needed for mild pain or headache.    [provider]  omeprazole (PRILOSEC) 20 MG capsule Take 1 capsule (20 mg total) by mouth daily. 12/14/17   Horton, Mayer Masker, MD  ondansetron  (ZOFRAN ODT) 4 MG disintegrating tablet Take 1 tablet (4 mg total) by mouth every 8 (eight) hours as needed for nausea or vomiting. 12/14/17   Horton, Mayer Masker, MD    Family History No family history on file.  Social History Social History   Tobacco Use  . Smoking status: Current Every Day Smoker    Packs/day: 1.00  . Smokeless tobacco: Never Used  Substance Use Topics  . Alcohol use: Yes    Alcohol/week: 36.0 standard drinks    Types: 36 Cans of beer per week  . Drug use: No     Allergies   Lac bovis; Penicillins; and Hydrocodone-acetaminophen   Review of Systems Review of Systems  Constitutional: Negative for chills and fever.  HENT: Negative for congestion and sore throat.   Eyes: Negative for visual disturbance.  Respiratory: Negative for cough, chest tightness and shortness of breath.   Cardiovascular: Positive for chest pain. Negative for palpitations and leg swelling.  Gastrointestinal: Positive for nausea and vomiting. Negative for abdominal pain.  Genitourinary: Negative for dysuria and flank pain.  Musculoskeletal: Negative for back pain and myalgias.  Skin: Negative for rash.  Neurological: Negative for dizziness and syncope.     Physical Exam Updated Vital Signs BP 107/65 (BP Location: Left Arm)   Pulse 79   Temp 97.9 F (36.6 C) (Oral)   Resp 18   Ht  5\' 5"  (1.651 m)   Wt 74.8 kg   SpO2 97%   BMI 27.44 kg/m   Physical Exam  Constitutional: He appears well-developed and well-nourished. No distress.  HENT:  Head: Normocephalic and atraumatic.  Mouth/Throat: Oropharynx is clear and moist.  Eyes: Pupils are equal, round, and reactive to light. Conjunctivae and EOM are normal.  Neck: Normal range of motion. Neck supple.  Cardiovascular: Normal rate, regular rhythm, S1 normal and S2 normal.  No murmur heard. Pulmonary/Chest: Effort normal and breath sounds normal. He has no wheezes. He has no rales.  Abdominal: Soft. He exhibits no distension.  There is no tenderness. There is no guarding.  Genitourinary:  Genitourinary Comments: Rectal examination performed with nurse tech chaperone present.  Normal rectal tone.  Soft brown stool in rectal vault.  No melena.  No masses.  No hemorrhoids.  Musculoskeletal: Normal range of motion. He exhibits no edema or deformity.  Neurological: He is alert.  Cranial nerves grossly intact. Patient moves extremities symmetrically and with good coordination.  Skin: Skin is warm and dry. No rash noted. No erythema.  Psychiatric: He has a normal mood and affect. His behavior is normal. Judgment and thought content normal.  Nursing note and vitals reviewed.    ED Treatments / Results  Labs (all labs ordered are listed, but only abnormal results are displayed) Labs Reviewed  COMPREHENSIVE METABOLIC PANEL - Abnormal; Notable for the following components:      Result Value   Sodium 131 (*)    Chloride 95 (*)    BUN <5 (*)    Creatinine, Ser 0.45 (*)    Calcium 8.8 (*)    AST 80 (*)    All other components within normal limits  CBC - Abnormal; Notable for the following components:   WBC 3.4 (*)    RBC 3.28 (*)    Hemoglobin 12.4 (*)    HCT 34.8 (*)    MCV 106.1 (*)    MCH 37.8 (*)    Platelets 121 (*)    All other components within normal limits  URINALYSIS, ROUTINE W REFLEX MICROSCOPIC - Abnormal; Notable for the following components:   Color, Urine STRAW (*)    Specific Gravity, Urine 1.002 (*)    All other components within normal limits  LIPASE, BLOOD  I-STAT TROPONIN, ED  POC OCCULT BLOOD, ED  I-STAT TROPONIN, ED  I-STAT TROPONIN, ED    EKG EKG Interpretation  Date/Time:  Saturday April 30 2018 20:48:39 EDT Ventricular Rate:  89 PR Interval:    QRS Duration: 103 QT Interval:  390 QTC Calculation: 475 R Axis:   89 Text Interpretation:  Sinus rhythm improved rate from previous, no other changes Confirmed by Marily Memos [phone]) on 04/30/2018 11:50:01 PM   Radiology No  results found.  Procedures Procedures (including critical care time)  Medications Ordered in ED Medications - No data to display   Initial Impression / Assessment and Plan / ED Course  I have reviewed the triage vital signs and the nursing notes.  Pertinent labs & imaging results that were available during my care of the patient were reviewed by me and considered in my medical decision making (see chart for details).  Clinical Course as of Apr 30 2346  Sat Apr 30, 2018  2008 At baseline.   WBC(!): 3.4 [AM]  2008 At baseline. Likely microcytic 2/2 alcoholism.  Hemoglobin(!): 12.4 [AM]  2317 No melena on exam.   Fecal Occult Blood, POC: NEGATIVE [AM]  2318 Patient has chronic hyponatremia.  Sodium(!): 131 [AM]    Clinical Course User Index [AM] Delia Chimes    Hemoglobin  Date Value Ref Range Status  04/30/2018 12.4 (L) 13.0 - 17.0 g/dL Final  69/62/9528 41.3 (L) 13.0 - 17.0 g/dL Final  24/40/1027 25.3 (L) 13.0 - 17.0 g/dL Final  66/44/0347 42.5 (L) 13.0 - 17.0 g/dL Final   Patient is nontoxic-appearing, afebrile, and in no acute distress.  Benign abdomen.  No melena on exam.  No evidence of vomiting in emergency department.  There is no coffee-ground emesis.   Hemoglobin is stable.  Patient has stable leukopenia at 3.4.  Patient has consistent hyponatremia.  Patient tolerated p.o. without vomiting.  Patient has slightly prolonged QT on EKG at 467, therefore will hold any antiemetics.  From chest pain standpoint, patient has negative delta troponin and nonischemic EKG.  Patient can follow-up outpatient.  Patient given resources for primary care follow-up.  Return precautions were given for any increasing pain, intractable nausea or vomiting.   Final Clinical Impressions(s) / ED Diagnoses   Final diagnoses:  Nausea  Diarrhea, unspecified type  Left-sided chest pain    ED Discharge Orders    None       Delia Chimes 05/01/18 0102    Marily Memos, MD 05/01/18 1907

## 2018-04-30 NOTE — ED Notes (Signed)
Pt reassessed for Fluid/PO challenge results. Pt ate two packs of crackers, and drank sprite. Pt states he stills feels nauseous but did not have any emesis.

## 2018-04-30 NOTE — ED Triage Notes (Addendum)
Pt BIBA from [facility] on [address]. Staff called for pt. Pt claims to have thrown up and vomited. Pt admits to ETOH. When asked, pt states he drank "too much"

## 2018-05-01 ENCOUNTER — Encounter (HOSPITAL_COMMUNITY): Payer: Self-pay | Admitting: Pharmacy Technician

## 2018-05-01 ENCOUNTER — Emergency Department (HOSPITAL_COMMUNITY): Payer: Medicaid Other

## 2018-05-01 ENCOUNTER — Other Ambulatory Visit: Payer: Self-pay

## 2018-05-01 ENCOUNTER — Emergency Department (HOSPITAL_COMMUNITY)
Admission: EM | Admit: 2018-05-01 | Discharge: 2018-05-01 | Disposition: A | Discharge status: Home / Self Care | Payer: Medicaid Other | Attending: Emergency Medicine | Admitting: Emergency Medicine

## 2018-05-01 DIAGNOSIS — F10929 Alcohol use, unspecified with intoxication, unspecified: Secondary | ICD-10-CM | POA: Insufficient documentation

## 2018-05-01 DIAGNOSIS — Z79899 Other long term (current) drug therapy: Secondary | ICD-10-CM | POA: Insufficient documentation

## 2018-05-01 DIAGNOSIS — F17210 Nicotine dependence, cigarettes, uncomplicated: Secondary | ICD-10-CM | POA: Insufficient documentation

## 2018-05-01 DIAGNOSIS — R0789 Other chest pain: Secondary | ICD-10-CM | POA: Insufficient documentation

## 2018-05-01 DIAGNOSIS — F10920 Alcohol use, unspecified with intoxication, uncomplicated: Secondary | ICD-10-CM

## 2018-05-01 LAB — BASIC METABOLIC PANEL
Anion gap: 12 (ref 5–15)
BUN: <5 mg/dL — ABNORMAL LOW (ref 6–20)
CO2: 20 mmol/L — ABNORMAL LOW (ref 22–32)
Calcium: 8.7 mg/dL — ABNORMAL LOW (ref 8.9–10.3)
Chloride: 97 mmol/L — ABNORMAL LOW (ref 98–111)
Creatinine, Ser: 0.57 mg/dL — ABNORMAL LOW (ref 0.61–1.24)
GFR calc Af Amer: >60 mL/min (ref >60)
GFR calc non Af Amer: >60 mL/min (ref >60)
Glucose, Bld: 82 mg/dL (ref 70–99)
Potassium: 3.9 mmol/L (ref 3.5–5.1)
Sodium: 129 mmol/L — ABNORMAL LOW (ref 135–145)

## 2018-05-01 LAB — I-STAT TROPONIN, ED
Troponin i, poc: 0.01 ng/mL (ref 0.00–0.08)
Troponin i, poc: 0.01 ng/mL (ref 0.00–0.08)

## 2018-05-01 LAB — ETHANOL: Alcohol, Ethyl (B): 151 mg/dL — ABNORMAL HIGH (ref <10)

## 2018-05-01 MED ORDER — SODIUM CHLORIDE 0.9 % IV BOLUS
500.0000 mL | Freq: Once | INTRAVENOUS | Status: AC
  Administered 2018-05-01: 500 mL via INTRAVENOUS

## 2018-05-01 MED ORDER — ONDANSETRON HCL 4 MG/2ML IJ SOLN
4.0000 mg | Freq: Once | INTRAMUSCULAR | Status: AC
  Administered 2018-05-01: 4 mg via INTRAVENOUS
  Filled 2018-05-01: qty 2

## 2018-05-01 MED ORDER — KETOROLAC TROMETHAMINE 15 MG/ML IJ SOLN
15.0000 mg | Freq: Once | INTRAMUSCULAR | Status: AC
  Administered 2018-05-01: 15 mg via INTRAVENOUS
  Filled 2018-05-01: qty 1

## 2018-05-01 NOTE — ED Notes (Signed)
Pt left discharge papers at the bedside.

## 2018-05-01 NOTE — Discharge Instructions (Addendum)
Please see the information and instructions below regarding your visit.  Your diagnoses today include:  1. Nausea   2. Diarrhea, unspecified type   3. Left-sided chest pain    Tests performed today include: See side panel of your discharge paperwork for testing performed today. Vital signs are listed at the bottom of these instructions.   Your work-up is reassuring today.  Medications prescribed:    Take any prescribed medications only as prescribed, and any over the counter medications only as directed on the packaging.  Home care instructions:  Please follow any educational materials contained in this packet.   Follow-up instructions: Please follow-up with your primary care provider as soon as possible for further evaluation of your symptoms if they are not completely improved.    Return instructions:  Please return to the Emergency Department if you experience worsening symptoms.  Please return to the emergency department if you develop any worsening symptoms, nausea or vomiting that prevents you from keeping anything down, chest pain or shortness of breath, feeling that you are going to pass out, or any new or worsening symptoms. Please return if you have any other emergent concerns.  Additional Information:   Your vital signs today were: BP 123/67 (BP Location: Left Arm)    Pulse 87    Temp 97.9 F (36.6 C) (Oral)    Resp 13    Ht 5\' 5"  (1.651 m)    Wt 74.8 kg    SpO2 96%    BMI 27.44 kg/m  If your blood pressure (BP) was elevated on multiple readings during this visit above 130 for the top number or above 80 for the bottom number, please have this repeated by your primary care provider within one month. --------------  Thank you for allowing [place] to participate in your care today.

## 2018-05-01 NOTE — ED Notes (Signed)
Patient verbalizes understanding of discharge instructions. Opportunity for questioning and answers were provided. Ambulatory at discharge in NAD.  

## 2018-05-01 NOTE — ED Triage Notes (Signed)
Pt arrives via EMS with L sided CP. ETOH on board. 324mg  ASA given en route. 12 Lead unremarkable. 116/73, HR 90, RR 14, 99% RA, CBG 106. Pt in NAD upon arrival.

## 2018-05-01 NOTE — ED Provider Notes (Signed)
[facility] EMERGENCY DEPARTMENT Provider Note   CSN: [id number] Arrival date & time: 05/01/18  1948     History   Chief Complaint Chief Complaint  Patient presents with  . Chest Pain    HPI Derek Norton is a 47 y.o. male.  46 year old male with prior medical history as detailed below presents with complaint of atypical chest pain.  Patient with long-standing history of alcohol use and abuse.  He presents frequently requesting IV fluids and a place to stay.  Today he reports very atypical chest pain.  This is been an ongoing issue for at least the last 2 to 3 days per his report.  He does appear to be intoxicated.  He was seen yesterday at [facility] ED for similar complaints.  3 troponins performed yesterday were negative.  He was released.  He returns today by EMS with a similar story.  He describes vague substernal chest discomfort.  His pain comes and goes intermittently.  There is no associated nausea, vomiting, shortness of breath, or other acute complaint.  The history is provided by the patient and medical records.  Chest Pain   This is a chronic problem. The current episode started more than 2 days ago. The problem occurs daily. The problem has not changed since onset.The pain is present in the substernal region. The pain is mild. The quality of the pain is described as burning. The pain does not radiate. He has tried nothing for the symptoms.    Past Medical History:  Diagnosis Date  . Alcohol withdrawal (HCC) 12/07/2017   pt states he "goes into DT's"  . Coronary artery disease   . DVT (deep venous thrombosis) (HCC)     There are no active problems to display for this patient.   Past Surgical History:  Procedure Laterality Date  . CARDIAC CATHETERIZATION          Home Medications    Prior to Admission medications   Medication Sig Start Date End Date Taking? Authorizing Provider  acetaminophen (TYLENOL) 325 MG tablet Take 650 mg by  mouth every 6 (six) hours as needed for mild pain or headache.    [provider]  omeprazole (PRILOSEC) 20 MG capsule Take 1 capsule (20 mg total) by mouth daily. 12/14/17   Horton, Mayer Masker, MD  ondansetron (ZOFRAN ODT) 4 MG disintegrating tablet Take 1 tablet (4 mg total) by mouth every 8 (eight) hours as needed for nausea or vomiting. 12/14/17   Horton, Mayer Masker, MD    Family History No family history on file.  Social History Social History   Tobacco Use  . Smoking status: Current Every Day Smoker    Packs/day: 1.00  . Smokeless tobacco: Never Used  Substance Use Topics  . Alcohol use: Yes    Alcohol/week: 36.0 standard drinks    Types: 36 Cans of beer per week  . Drug use: No     Allergies   Lac bovis; Penicillins; and Hydrocodone-acetaminophen   Review of Systems Review of Systems  Cardiovascular: Positive for chest pain.  All other systems reviewed and are negative.    Physical Exam Updated Vital Signs BP 118/78   Pulse 80   Resp 15   Ht 5' 4.96" (1.65 m)   Wt 74.8 kg   SpO2 98%   BMI 27.47 kg/m   Physical Exam  Constitutional: He is oriented to person, place, and time. He appears well-developed and well-nourished. No distress.  HENT:  Head:  Normocephalic and atraumatic.  Mouth/Throat: Oropharynx is clear and moist.  Eyes: Pupils are equal, round, and reactive to light. Conjunctivae and EOM are normal.  Neck: Normal range of motion. Neck supple.  Cardiovascular: Normal rate, regular rhythm and normal heart sounds.  Pulmonary/Chest: Effort normal and breath sounds normal. No respiratory distress.  Abdominal: Soft. He exhibits no distension. There is no tenderness.  Musculoskeletal: Normal range of motion. He exhibits no edema or deformity.  Neurological: He is alert and oriented to person, place, and time.  Skin: Skin is warm and dry. Abrasion noted.  Psychiatric: He has a normal mood and affect.  Nursing note and vitals reviewed.    ED  Treatments / Results  Labs (all labs ordered are listed, but only abnormal results are displayed) Labs Reviewed  BASIC METABOLIC PANEL - Abnormal; Notable for the following components:      Result Value   Sodium 129 (*)    Chloride 97 (*)    CO2 20 (*)    BUN <5 (*)    Creatinine, Ser 0.57 (*)    Calcium 8.7 (*)    All other components within normal limits  ETHANOL - Abnormal; Notable for the following components:   Alcohol, Ethyl (B) 151 (*)    All other components within normal limits  CBC WITH DIFFERENTIAL/PLATELET  I-STAT TROPONIN, ED    EKG EKG Interpretation  Date/Time:  Sunday May 01 2018 19:58:46 EDT Ventricular Rate:  91 PR Interval:    QRS Duration: 90 QT Interval:  383 QTC Calculation: 472 R Axis:   92 Text Interpretation:  Sinus rhythm Borderline right axis deviation Baseline wander in lead(s) V1 Confirmed by Kristine Royal [phone]) on 05/01/2018 8:01:49 PM   Radiology Dg Chest Port 1 View  Result Date: 05/01/2018 CLINICAL DATA:  Left-sided chest pain. EXAM: PORTABLE CHEST 1 VIEW COMPARISON:  04/21/2018 FINDINGS: 2009 hours. The lungs are clear without focal pneumonia, edema, pneumothorax or pleural effusion. The cardiopericardial silhouette is within normal limits for size. The visualized bony structures of the thorax are intact. Telemetry leads overlie the chest. IMPRESSION: No active disease. Electronically Signed   By: Kennith Center M.D.   On: 05/01/2018 20:29    Procedures Procedures (including critical care time)  Medications Ordered in ED Medications  sodium chloride 0.9 % bolus 500 mL (has no administration in time range)  ondansetron (ZOFRAN) injection 4 mg (has no administration in time range)  ketorolac (TORADOL) 15 MG/ML injection 15 mg (has no administration in time range)     Initial Impression / Assessment and Plan / ED Course  I have reviewed the triage vital signs and the nursing notes.  Pertinent labs & imaging results that were  available during my care of the patient were reviewed by me and considered in my medical decision making (see chart for details).     MDM  Screen complete  Patient is presenting for evaluation of atypical chest pain.  EKG today does not demonstrate evidence of acute ischemia.  Troponin today is negative.  Patient also had 3 troponins performed yesterday which were also negative.  I do not feel that this patient requires additional work-up for his reported chest discomfort.  Patient is mildly intoxicated upon my initial evaluation today.    He appears to be improved following his ED work-up and treatment.  He appears to be stable for discharge.  Patient given strict return precautions.  Importance of close follow-up stressed.  Final Clinical Impressions(s) / ED Diagnoses  Final diagnoses:  Atypical chest pain  Alcoholic intoxication without complication [facility])    ED Discharge Orders    None       Wynetta Fines, MD 05/01/18 2143

## 2018-05-01 NOTE — Discharge Instructions (Signed)
Please return for any problem.  Follow up with a regular doctor as instructed. °

## 2018-05-03 ENCOUNTER — Encounter (HOSPITAL_COMMUNITY): Payer: Self-pay | Admitting: *Deleted

## 2018-05-03 ENCOUNTER — Other Ambulatory Visit: Payer: Self-pay

## 2018-05-03 ENCOUNTER — Emergency Department (HOSPITAL_COMMUNITY): Payer: Medicaid Other

## 2018-05-03 ENCOUNTER — Emergency Department (HOSPITAL_COMMUNITY)
Admission: EM | Admit: 2018-05-03 | Discharge: 2018-05-04 | Disposition: A | Discharge status: Home / Self Care | Payer: Medicaid Other | Attending: Emergency Medicine | Admitting: Emergency Medicine

## 2018-05-03 DIAGNOSIS — F10920 Alcohol use, unspecified with intoxication, uncomplicated: Secondary | ICD-10-CM | POA: Insufficient documentation

## 2018-05-03 DIAGNOSIS — R0602 Shortness of breath: Secondary | ICD-10-CM | POA: Insufficient documentation

## 2018-05-03 DIAGNOSIS — R0789 Other chest pain: Secondary | ICD-10-CM | POA: Insufficient documentation

## 2018-05-03 DIAGNOSIS — F17210 Nicotine dependence, cigarettes, uncomplicated: Secondary | ICD-10-CM | POA: Insufficient documentation

## 2018-05-03 DIAGNOSIS — I2510 Atherosclerotic heart disease of native coronary artery without angina pectoris: Secondary | ICD-10-CM | POA: Insufficient documentation

## 2018-05-03 DIAGNOSIS — F1020 Alcohol dependence, uncomplicated: Secondary | ICD-10-CM | POA: Insufficient documentation

## 2018-05-03 DIAGNOSIS — Z79899 Other long term (current) drug therapy: Secondary | ICD-10-CM | POA: Insufficient documentation

## 2018-05-03 DIAGNOSIS — R112 Nausea with vomiting, unspecified: Secondary | ICD-10-CM | POA: Insufficient documentation

## 2018-05-03 LAB — URINALYSIS, ROUTINE W REFLEX MICROSCOPIC
Bilirubin Urine: NEGATIVE
Glucose, UA: NEGATIVE mg/dL
Hgb urine dipstick: NEGATIVE
Ketones, ur: NEGATIVE mg/dL
Leukocytes, UA: NEGATIVE
Nitrite: NEGATIVE
Protein, ur: NEGATIVE mg/dL
Specific Gravity, Urine: 1.002 — ABNORMAL LOW (ref 1.005–1.030)
pH: 6 (ref 5.0–8.0)

## 2018-05-03 LAB — RAPID URINE DRUG SCREEN, HOSP PERFORMED
Amphetamines: NOT DETECTED
Barbiturates: NOT DETECTED
Benzodiazepines: NOT DETECTED
Cocaine: NOT DETECTED
Opiates: NOT DETECTED
Tetrahydrocannabinol: NOT DETECTED

## 2018-05-03 LAB — COMPREHENSIVE METABOLIC PANEL
ALT: 37 U/L (ref 0–44)
AST: 92 U/L — ABNORMAL HIGH (ref 15–41)
Albumin: 3.4 g/dL — ABNORMAL LOW (ref 3.5–5.0)
Alkaline Phosphatase: 97 U/L (ref 38–126)
Anion gap: 9 (ref 5–15)
BUN: <5 mg/dL — ABNORMAL LOW (ref 6–20)
CO2: 22 mmol/L (ref 22–32)
Calcium: 7.9 mg/dL — ABNORMAL LOW (ref 8.9–10.3)
Chloride: 98 mmol/L (ref 98–111)
Creatinine, Ser: 0.52 mg/dL — ABNORMAL LOW (ref 0.61–1.24)
GFR calc Af Amer: >60 mL/min (ref >60)
GFR calc non Af Amer: >60 mL/min (ref >60)
Glucose, Bld: 88 mg/dL (ref 70–99)
Potassium: 3.6 mmol/L (ref 3.5–5.1)
Sodium: 129 mmol/L — ABNORMAL LOW (ref 135–145)
Total Bilirubin: 0.5 mg/dL (ref 0.3–1.2)
Total Protein: 5.8 g/dL — ABNORMAL LOW (ref 6.5–8.1)

## 2018-05-03 LAB — ACETAMINOPHEN LEVEL: Acetaminophen (Tylenol), Serum: <10 ug/mL — ABNORMAL LOW (ref 10–30)

## 2018-05-03 LAB — SALICYLATE LEVEL: Salicylate Lvl: <7 mg/dL (ref 2.8–30.0)

## 2018-05-03 LAB — LIPASE, BLOOD: Lipase: 49 U/L (ref 11–51)

## 2018-05-03 LAB — I-STAT TROPONIN, ED: Troponin i, poc: 0.01 ng/mL (ref 0.00–0.08)

## 2018-05-03 MED ORDER — SODIUM CHLORIDE 0.9 % IV BOLUS
1000.0000 mL | Freq: Once | INTRAVENOUS | Status: AC
  Administered 2018-05-03: 1000 mL via INTRAVENOUS

## 2018-05-03 NOTE — ED Notes (Signed)
Pt. Returned from XRAY via stretcher. 

## 2018-05-03 NOTE — ED Provider Notes (Signed)
[facility] EMERGENCY DEPARTMENT Provider Note   CSN: [id number] Arrival date & time: 05/03/18  2217     History   Chief Complaint Chief Complaint  Patient presents with  . Chest Pain    HPI Derek Norton is a 47 y.o. male.  Derek Norton is a 47 y.o. Male history of CAD, DVT and alcohol abuse, who presents to the emergency department via EMS for evaluation of chest pain radiating to left arm with associated nausea vomiting and shortness of breath.  Patient appears, no new acute distress but reports he cannot breathe at all.  He received 1 dose of nitro with EMS and 4 of Zofran but reports no improvement.  He admits to increasing amounts of alcohol use and drink 4 to 6 25 ounce beers prior to arrival, she reports he is drinking more because he is depressed and he does not want to live anymore but denies specific plan for suicide and denies homicidal ideations.  He reports he is seeing demons he denies any auditory hallucinations.  Patient reports chronic lower abdominal pain with associated vomiting and diarrhea this is unchanged.  He denies fevers or chills.  No cough.  Denies urinary symptoms.  Denies hematemesis, melena or hematochezia.  Patient frequently presents to the ED with similar symptoms requesting IV fluids and a place to stay.     Past Medical History:  Diagnosis Date  . Alcohol withdrawal (HCC) 12/07/2017   pt states he "goes into DT's"  . Coronary artery disease   . DVT (deep venous thrombosis) (HCC)     There are no active problems to display for this patient.   Past Surgical History:  Procedure Laterality Date  . CARDIAC CATHETERIZATION          Home Medications    Prior to Admission medications   Medication Sig Start Date End Date Taking? Authorizing Provider  acetaminophen (TYLENOL) 325 MG tablet Take 650 mg by mouth every 6 (six) hours as needed for mild pain or headache.    [provider]  omeprazole (PRILOSEC) 20  MG capsule Take 1 capsule (20 mg total) by mouth daily. 12/14/17   Horton, Mayer Masker, MD  ondansetron (ZOFRAN ODT) 4 MG disintegrating tablet Take 1 tablet (4 mg total) by mouth every 8 (eight) hours as needed for nausea or vomiting. 12/14/17   Horton, Mayer Masker, MD    Family History No family history on file.  Social History Social History   Tobacco Use  . Smoking status: Current Every Day Smoker    Packs/day: 1.00  . Smokeless tobacco: Never Used  Substance Use Topics  . Alcohol use: Yes    Alcohol/week: 36.0 standard drinks    Types: 36 Cans of beer per week  . Drug use: No     Allergies   Lac bovis; Penicillins; and Hydrocodone-acetaminophen   Review of Systems Review of Systems  Constitutional: Negative for chills and fever.  HENT: Negative.   Eyes: Negative for visual disturbance.  Respiratory: Positive for shortness of breath. Negative for cough, chest tightness and wheezing.   Gastrointestinal: Positive for abdominal pain, diarrhea, nausea and vomiting. Negative for blood in stool and constipation.  Genitourinary: Negative for dysuria and frequency.  Musculoskeletal: Negative for arthralgias and myalgias.  Skin: Negative for color change and rash.  Neurological: Negative for dizziness, weakness, light-headedness, numbness and headaches.     Physical Exam Updated Vital Signs BP 94/63 (BP Location: Right Arm)   Pulse 86  Temp (!) 97.5 F (36.4 C) (Oral)   Resp 17   SpO2 97%   Physical Exam  Constitutional: He appears well-developed and well-nourished. He does not appear ill. No distress.  Patient appears disheveled and intoxicated, dirty clothing but is in no acute distress  HENT:  Head: Normocephalic and atraumatic.  Eyes: Right eye exhibits no discharge. Left eye exhibits no discharge.  Neck: Normal range of motion. Neck supple. No JVD present. No tracheal deviation present.  Cardiovascular: Normal rate and regular rhythm. Exam reveals no gallop and no  friction rub.  No murmur heard. Pulses:      Radial pulses are 2+ on the right side, and 2+ on the left side.       Dorsalis pedis pulses are 2+ on the right side, and 2+ on the left side.       Posterior tibial pulses are 2+ on the right side, and 2+ on the left side.  Pulmonary/Chest: Effort normal and breath sounds normal. No respiratory distress.  Respirations equal and unlabored, patient able to speak in full sentences, lungs clear to auscultation bilaterally  Abdominal: Soft. Bowel sounds are normal. He exhibits no distension and no mass. There is tenderness. There is no guarding.  Abdomen is soft, nondistended, bowel sounds present throughout, patient has mild diffuse tenderness without focal guarding, no rebound tenderness or rigidity  Musculoskeletal:       Right lower leg: Normal. He exhibits no tenderness and no edema.       Left lower leg: Normal. He exhibits no tenderness and no edema.  Neurological: He is alert. Coordination normal.  Alert, speech is slightly slurred, following commands Moving all extremities without difficulty  Skin: He is not diaphoretic.  Psychiatric: He has a normal mood and affect. His behavior is normal.  Nursing note and vitals reviewed.    ED Treatments / Results  Labs (all labs ordered are listed, but only abnormal results are displayed) Labs Reviewed  CBC - Abnormal; Notable for the following components:      Result Value   WBC 3.2 (*)    RBC 3.12 (*)    Hemoglobin 11.4 (*)    HCT 34.2 (*)    MCV 109.6 (*)    MCH 36.5 (*)    Platelets 110 (*)    All other components within normal limits  ETHANOL - Abnormal; Notable for the following components:   Alcohol, Ethyl (B) 338 (*)    All other components within normal limits  COMPREHENSIVE METABOLIC PANEL - Abnormal; Notable for the following components:   Sodium 129 (*)    BUN <5 (*)    Creatinine, Ser 0.52 (*)    Calcium 7.9 (*)    Total Protein 5.8 (*)    Albumin 3.4 (*)    AST 92 (*)     All other components within normal limits  URINALYSIS, ROUTINE W REFLEX MICROSCOPIC - Abnormal; Notable for the following components:   Color, Urine STRAW (*)    Specific Gravity, Urine 1.002 (*)    All other components within normal limits  ACETAMINOPHEN LEVEL - Abnormal; Notable for the following components:   Acetaminophen (Tylenol), Serum <10 (*)    All other components within normal limits  RAPID URINE DRUG SCREEN, HOSP PERFORMED  LIPASE, BLOOD  SALICYLATE LEVEL  I-STAT TROPONIN, ED    EKG EKG Interpretation  Date/Time:  Tuesday May 03 2018 22:26:30 EDT Ventricular Rate:  82 PR Interval:    QRS Duration: 91 QT Interval:  410 QTC Calculation: 479 R Axis:   90 Text Interpretation:  Sinus rhythm Borderline right axis deviation ST elev, probable normal early repol pattern Borderline prolonged QT interval No significant change since last tracing Confirmed by Linwood Dibbles [phone]) on 05/03/2018 10:33:20 PM   Radiology No results found.  Procedures Procedures (including critical care time)  Medications Ordered in ED Medications  sodium chloride 0.9 % bolus 1,000 mL (0 mLs Intravenous Stopped 05/04/18 0014)     Initial Impression / Assessment and Plan / ED Course  I have reviewed the triage vital signs and the nursing notes.  Pertinent labs & imaging results that were available during my care of the patient were reviewed by me and considered in my medical decision making (see chart for details).  Patient presents for evaluation of left-sided chest pain and radiating to the left arm that has been present intermittently for the past day he also reports associated nausea and vomiting and generalized just chronic in nature.  Patient reports he feels dehydrated and requesting IV fluids, he has been seen several times with similar presentation.  Patient also appears intoxicated and has a long history of alcohol abuse.  Reports increasing alcohol use due to worsening depression  and thoughts of not wanting to live anymore he also endorses visual hallucinations of demons, denies auditory hallucinations or HI.  Will check chest pain labs as well as abdominal labs, UDS, ethanol, Tylenol and salicylate levels.  Will give IV fluids.  Patient has had no active vomiting.   Low suspicion for ACS given numerous presentations with similar symptoms, EKG without concerning ischemic changes.  Chest x-ray is clear.  Initial troponin is negative.  Patient has chronic pancytopenia's which are at baseline, chronic mild hyponatremia of 129, no other acute electrolyte derangements requiring intervention, renal and liver function are at baseline.  Normal lipase, negative salicylate and acetaminophen levels.  Ethanol was very elevated at 338.  UDS clear.  Patient will need to clinically sober at which point will reassess suicidal ideations and hallucinations.  Pt will also need delta troponin at 1:40 AM at shift change care signed out to PA Lyndel Safe who will follow-up on delta troponin and reassess patient to determine if further psychiatric evaluation will be needed.  Final Clinical Impressions(s) / ED Diagnoses   Final diagnoses:  Atypical chest pain  Non-intractable vomiting with nausea, unspecified vomiting type  Alcoholic intoxication without complication [facility])    ED Discharge Orders    None       Legrand Rams 05/04/18 Brent General, MD 05/04/18 2337

## 2018-05-03 NOTE — ED Notes (Signed)
Pt. Return from CT via stretcher. 

## 2018-05-03 NOTE — ED Triage Notes (Signed)
Pt is homeless, called EMS for CP radiating to L arm with nausea, sob. EMS gave nitro x 1 and 4mg  zofran with no improvement. Admits to ETOH use, more so than normal due to worsening depression. Pt requesting IV fluids

## 2018-05-04 LAB — CBC
HCT: 34.2 % — ABNORMAL LOW (ref 39.0–52.0)
Hemoglobin: 11.4 g/dL — ABNORMAL LOW (ref 13.0–17.0)
MCH: 36.5 pg — ABNORMAL HIGH (ref 26.0–34.0)
MCHC: 33.3 g/dL (ref 30.0–36.0)
MCV: 109.6 fL — ABNORMAL HIGH (ref 80.0–100.0)
Platelets: 110 10*3/uL — ABNORMAL LOW (ref 150–400)
RBC: 3.12 MIL/uL — ABNORMAL LOW (ref 4.22–5.81)
RDW: 14.8 % (ref 11.5–15.5)
WBC: 3.2 10*3/uL — ABNORMAL LOW (ref 4.0–10.5)
nRBC: 0 % (ref 0.0–0.2)

## 2018-05-04 LAB — I-STAT TROPONIN, ED: Troponin i, poc: 0 ng/mL (ref 0.00–0.08)

## 2018-05-04 LAB — ETHANOL: Alcohol, Ethyl (B): 338 mg/dL (ref <10)

## 2018-05-04 MED ORDER — LORAZEPAM 1 MG PO TABS: 0.0000 mg | ORAL_TABLET | Freq: Four times a day (QID) | ORAL | Status: DC

## 2018-05-04 MED ORDER — LORAZEPAM 1 MG PO TABS: 0.0000 mg | ORAL_TABLET | Freq: Two times a day (BID) | ORAL | Status: DC

## 2018-05-04 MED ORDER — THIAMINE HCL 100 MG/ML IJ SOLN: 100.0000 mg | Freq: Every day | INTRAMUSCULAR | Status: DC

## 2018-05-04 MED ORDER — LORAZEPAM 2 MG/ML IJ SOLN: 0.0000 mg | Freq: Two times a day (BID) | INTRAMUSCULAR | Status: DC

## 2018-05-04 MED ORDER — VITAMIN B-1 100 MG PO TABS
100.0000 mg | ORAL_TABLET | Freq: Every day | ORAL | Status: DC
  Administered 2018-05-04: 100 mg via ORAL
  Filled 2018-05-04: qty 1

## 2018-05-04 MED ORDER — LORAZEPAM 2 MG/ML IJ SOLN: 0.0000 mg | Freq: Four times a day (QID) | INTRAMUSCULAR | Status: DC

## 2018-05-04 NOTE — Progress Notes (Signed)
CSW contacted [facility] ED CSW, [place] Dom, LCSW, and requested bus pass and PART pass so that pt could go to [employer].  Derek Norton. Kaylyn Lim, MSW, LCSWA Disposition Clinical Social Work [phone] (cell) [phone] (office)
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
## 2018-05-04 NOTE — Discharge Instructions (Signed)
Please try and reduce your alcohol intake, this will likely help with your nausea and abdominal pain.  Your chest pain work-up today was reassuring

## 2018-05-04 NOTE — ED Notes (Signed)
Patient belongings inventoried and placed in in locker # 4; patient has 1 pair of paints, a flannel shirt; 1 coat, and 1 pair of sneakers-Monique,RN

## 2018-05-04 NOTE — ED Provider Notes (Signed)
Assumed care of patient at shift change, please see note by Jodi Geralds, PA-C for full H&P.  Briefly patient is here for multiple complaints.  Plan is to reevaluate once he has had time to metabolize his alcohol.  I spoke with patient.  He states that physically he feels better, however he does not want to live anymore.  He states that he has been seeing the devil and the devil speaks to him.  He denies any HI.     Physical Exam  BP 96/61   Pulse 78   Temp (!) 97.5 F (36.4 C) (Oral)   Resp 14   SpO2 97%   Physical Exam  Constitutional: He appears well-developed and well-nourished. No distress.  HENT:  Head: Normocephalic and atraumatic.  Eyes: Conjunctivae are normal. Right eye exhibits no discharge. Left eye exhibits no discharge. No scleral icterus.  Neck: Normal range of motion.  Cardiovascular: Normal rate and regular rhythm.  Pulmonary/Chest: Effort normal. No stridor. No respiratory distress.  Abdominal: He exhibits no distension.  Musculoskeletal: He exhibits no edema or deformity.  Neurological: He is alert. He exhibits normal muscle tone.  Skin: Skin is warm and dry. He is not diaphoretic.  Psychiatric:  Vague SI, reports seeing the devil.   Nursing note and vitals reviewed.   ED Course/Procedures     Procedures  MDM    Patient presents today for evaluation of multiple complaints. He reports they have all resolved except for his SI and seeing the devil.  He had two negative troponin's, EKG with out acute abnormalities. His ethanol is elevated, consistent with intoxication.  Remainder of labs appear consistent with is baseline.  Lipase is not elevated.  Patient is medically clear for psychiatric eval.  CIWA orders placed.        Cristina Gong, PA-C 05/04/18 0647    Ward, Layla Maw, DO 05/04/18 [phone]

## 2018-05-06 ENCOUNTER — Emergency Department (HOSPITAL_COMMUNITY)
Admission: EM | Admit: 2018-05-06 | Discharge: 2018-05-06 | Disposition: A | Discharge status: Home / Self Care | Payer: Medicaid Other | Attending: Emergency Medicine | Admitting: Emergency Medicine

## 2018-05-06 ENCOUNTER — Emergency Department (HOSPITAL_COMMUNITY): Payer: Medicaid Other

## 2018-05-06 ENCOUNTER — Emergency Department (HOSPITAL_COMMUNITY)
Admission: EM | Admit: 2018-05-06 | Discharge: 2018-05-06 | Discharge status: Against Medical Advice | Payer: Self-pay | Attending: Emergency Medicine | Admitting: Emergency Medicine

## 2018-05-06 ENCOUNTER — Other Ambulatory Visit: Payer: Self-pay

## 2018-05-06 ENCOUNTER — Encounter (HOSPITAL_COMMUNITY): Payer: Self-pay | Admitting: Emergency Medicine

## 2018-05-06 ENCOUNTER — Encounter (HOSPITAL_COMMUNITY): Payer: Self-pay

## 2018-05-06 DIAGNOSIS — F17210 Nicotine dependence, cigarettes, uncomplicated: Secondary | ICD-10-CM | POA: Insufficient documentation

## 2018-05-06 DIAGNOSIS — I2510 Atherosclerotic heart disease of native coronary artery without angina pectoris: Secondary | ICD-10-CM | POA: Insufficient documentation

## 2018-05-06 DIAGNOSIS — R079 Chest pain, unspecified: Secondary | ICD-10-CM | POA: Insufficient documentation

## 2018-05-06 DIAGNOSIS — F17200 Nicotine dependence, unspecified, uncomplicated: Secondary | ICD-10-CM | POA: Insufficient documentation

## 2018-05-06 DIAGNOSIS — R52 Pain, unspecified: Secondary | ICD-10-CM | POA: Insufficient documentation

## 2018-05-06 LAB — CBC
HCT: 35.7 % — ABNORMAL LOW (ref 39.0–52.0)
Hemoglobin: 12.1 g/dL — ABNORMAL LOW (ref 13.0–17.0)
MCH: 36.9 pg — ABNORMAL HIGH (ref 26.0–34.0)
MCHC: 33.9 g/dL (ref 30.0–36.0)
MCV: 108.8 fL — ABNORMAL HIGH (ref 80.0–100.0)
Platelets: 100 10*3/uL — ABNORMAL LOW (ref 150–400)
RBC: 3.28 MIL/uL — ABNORMAL LOW (ref 4.22–5.81)
RDW: 15.4 % (ref 11.5–15.5)
WBC: 3.5 10*3/uL — ABNORMAL LOW (ref 4.0–10.5)
nRBC: 0 % (ref 0.0–0.2)

## 2018-05-06 LAB — BASIC METABOLIC PANEL
Anion gap: 11 (ref 5–15)
BUN: <5 mg/dL — ABNORMAL LOW (ref 6–20)
CO2: 26 mmol/L (ref 22–32)
Calcium: 8.3 mg/dL — ABNORMAL LOW (ref 8.9–10.3)
Chloride: 100 mmol/L (ref 98–111)
Creatinine, Ser: 0.58 mg/dL — ABNORMAL LOW (ref 0.61–1.24)
GFR calc Af Amer: >60 mL/min (ref >60)
GFR calc non Af Amer: >60 mL/min (ref >60)
Glucose, Bld: 81 mg/dL (ref 70–99)
Potassium: 3.9 mmol/L (ref 3.5–5.1)
Sodium: 137 mmol/L (ref 135–145)

## 2018-05-06 LAB — I-STAT TROPONIN, ED: Troponin i, poc: 0 ng/mL (ref 0.00–0.08)

## 2018-05-06 MED ORDER — ONDANSETRON 4 MG PO TBDP
8.0000 mg | ORAL_TABLET | Freq: Once | ORAL | Status: AC
  Administered 2018-05-06: 8 mg via ORAL
  Filled 2018-05-06: qty 2

## 2018-05-06 NOTE — ED Provider Notes (Signed)
[facility] EMERGENCY DEPARTMENT Provider Note   CSN: [id number] Arrival date & time: 05/06/18  2026     History   Chief Complaint Chief Complaint  Patient presents with  . Generalized Body Aches    HPI Derek Norton is a 47 y.o. male.  The history is provided by the patient and medical records. No language interpreter was used.   Derek Norton is a 47 y.o. male who presents to the Emergency Department via EMS for generalized body pain. He was found by EMS at [employer]. I saw the patient in triage. When asked why he is in the ER today, he responds "why are you here today?" I asked him if anything hurts and he responded "I just want a beer and to go to sleep." I asked again if his chest hurt and he said "no, now there you go" and turned away from me.    Past Medical History:  Diagnosis Date  . Alcohol withdrawal (HCC) 12/07/2017   pt states he "goes into DT's"  . Coronary artery disease   . DVT (deep venous thrombosis) (HCC)     There are no active problems to display for this patient.   Past Surgical History:  Procedure Laterality Date  . CARDIAC CATHETERIZATION          Home Medications    Prior to Admission medications   Medication Sig Start Date End Date Taking? Authorizing Provider  omeprazole (PRILOSEC) 20 MG capsule Take 1 capsule (20 mg total) by mouth daily. Patient not taking: Reported on 05/03/2018 12/14/17   Horton, Mayer Masker, MD  ondansetron (ZOFRAN ODT) 4 MG disintegrating tablet Take 1 tablet (4 mg total) by mouth every 8 (eight) hours as needed for nausea or vomiting. Patient not taking: Reported on 05/03/2018 12/14/17   Horton, Mayer Masker, MD    Family History No family history on file.  Social History Social History   Tobacco Use  . Smoking status: Current Every Day Smoker    Packs/day: 1.00  . Smokeless tobacco: Never Used  Substance Use Topics  . Alcohol use: Yes    Alcohol/week: 36.0 standard drinks    Types: 36  Cans of beer per week  . Drug use: No     Allergies   Lac bovis; Penicillins; and Hydrocodone-acetaminophen   Review of Systems Review of Systems  Unable to perform ROS: Other (Patient not cooperative)     Physical Exam Updated Vital Signs There were no vitals taken for this visit.  Physical Exam  Constitutional: He is oriented to person, place, and time. He appears well-developed and well-nourished. No distress.  HENT:  Head: Normocephalic and atraumatic.  Neck: Neck supple.  Pulmonary/Chest: Effort normal.  Musculoskeletal: Normal range of motion.  Neurological: He is alert and oriented to person, place, and time.  Skin: Skin is warm and dry.  Nursing note and vitals reviewed.    ED Treatments / Results  Labs (all labs ordered are listed, but only abnormal results are displayed) Labs Reviewed - No data to display  EKG None  Radiology Dg Chest 2 View  Result Date: 05/06/2018 CLINICAL DATA:  Chest pain EXAM: CHEST - 2 VIEW COMPARISON:  05/03/2018 FINDINGS: The heart size and mediastinal contours are within normal limits. Both lungs are clear. The visualized skeletal structures are unremarkable. IMPRESSION: No active cardiopulmonary disease. Electronically Signed   By: Burman Nieves M.D.   On: 05/06/2018 04:32    Procedures Procedures (including critical care  time)  Medications Ordered in ED Medications - No data to display   Initial Impression / Assessment and Plan / ED Course  I have reviewed the triage vital signs and the nursing notes.  Pertinent labs & imaging results that were available during my care of the patient were reviewed by me and considered in my medical decision making (see chart for details).    Derek Norton is a 47 y.o. male who presented to ED via EMS after being found at [employer]. Patient complained of "pain all over" to EMS. Upon ED arrival, I evaluated patient in triage. He was not cooperative and would not answer my questions.  Multiple attempts were made to evaluate patient, however he refused. He continued to tell me that he wanted a cigarette, a beer or to sleep. I tried to perform cardiopulmonary examination, but he turned away from me and told me to stop. Patient tried to get up and walk out. Discussed that we could not guarantee life threatening medical condition without evaluation. He still wants to leave. Discharged AMA. He understands that he can return at any time for evaluation.   Final Clinical Impressions(s) / ED Diagnoses   Final diagnoses:  Generalized pain    ED Discharge Orders    None       Tanajah Boulter, Chase Picket, PA-C 05/06/18 2045    Gwyneth Sprout, MD 05/06/18 2342

## 2018-05-06 NOTE — Discharge Instructions (Addendum)
You were brought to the Emergency Department for evaluation.  You declined any medical evaluation upon arrival.  You left the hospital against medical advice.  You may return at any time for evaluation.

## 2018-05-06 NOTE — ED Provider Notes (Signed)
[facility] EMERGENCY DEPARTMENT Provider Note   CSN: [id number] Arrival date & time: 05/06/18  0315     History   Chief Complaint Chief Complaint  Patient presents with  . Chest Pain    HPI JANSSEN ZEE is a 47 y.o. male.  47 year old male with a history of coronary artery disease presents to the emergency department for evaluation of chest pain.  Per EMS, they explained to the patient that he could not sleep at [facility] and he replied, "then I guess I have chest pain".  Endorses alcohol consumption tonight.  Received 324 mg aspirin by EMS prior to arrival.  Allegedly refused IVs in transport.  Describes a pressure-like nonradiating chest pain, similar to previous ED visits for same.  No fevers, syncope, leg swelling.  This is the patient's 6th visit to the ED in the past 11 days for chest pain and ETOH intoxication.  The history is provided by the patient. No language interpreter was used.  Chest Pain      Past Medical History:  Diagnosis Date  . Alcohol withdrawal (HCC) 12/07/2017   pt states he "goes into DT's"  . Coronary artery disease   . DVT (deep venous thrombosis) (HCC)     There are no active problems to display for this patient.   Past Surgical History:  Procedure Laterality Date  . CARDIAC CATHETERIZATION          Home Medications    Prior to Admission medications   Medication Sig Start Date End Date Taking? Authorizing Provider  omeprazole (PRILOSEC) 20 MG capsule Take 1 capsule (20 mg total) by mouth daily. Patient not taking: Reported on 05/03/2018 12/14/17   Horton, Mayer Masker, MD  ondansetron (ZOFRAN ODT) 4 MG disintegrating tablet Take 1 tablet (4 mg total) by mouth every 8 (eight) hours as needed for nausea or vomiting. Patient not taking: Reported on 05/03/2018 12/14/17   Horton, Mayer Masker, MD    Family History No family history on file.  Social History Social History   Tobacco Use  . Smoking status: Current Every  Day Smoker    Packs/day: 1.00  . Smokeless tobacco: Never Used  Substance Use Topics  . Alcohol use: Yes    Alcohol/week: 36.0 standard drinks    Types: 36 Cans of beer per week  . Drug use: No     Allergies   Lac bovis; Penicillins; and Hydrocodone-acetaminophen   Review of Systems Review of Systems  Cardiovascular: Positive for chest pain.  Ten systems reviewed and are negative for acute change, except as noted in the HPI.    Physical Exam Updated Vital Signs BP 111/80 (BP Location: Right Arm)   Pulse 81   Temp 97.9 F (36.6 C) (Oral)   Resp 16   Ht 5\' 11"  (1.803 m)   SpO2 98%   BMI 23.00 kg/m   Physical Exam  Constitutional: He is oriented to person, place, and time. He appears well-developed and well-nourished. No distress.  Nontoxic appearing. Disheveled. Smells of urine.  HENT:  Head: Normocephalic and atraumatic.  Eyes: Conjunctivae and EOM are normal. No scleral icterus.  Neck: Normal range of motion.  Cardiovascular: Normal rate, regular rhythm and intact distal pulses.  Pulmonary/Chest: Effort normal. No stridor. No respiratory distress. He has no wheezes. He has no rales.  Lungs CTAB.  Musculoskeletal: Normal range of motion.  Neurological: He is alert and oriented to person, place, and time. He exhibits normal muscle tone. Coordination normal.  Moving  all extremities spontaneously.  Skin: Skin is warm and dry. No rash noted. He is not diaphoretic. No erythema. No pallor.  Psychiatric: He has a normal mood and affect. His behavior is normal.  Nursing note and vitals reviewed.    ED Treatments / Results  Labs (all labs ordered are listed, but only abnormal results are displayed) Labs Reviewed  BASIC METABOLIC PANEL - Abnormal; Notable for the following components:      Result Value   BUN <5 (*)    Creatinine, Ser 0.58 (*)    Calcium 8.3 (*)    All other components within normal limits  CBC - Abnormal; Notable for the following components:   WBC  3.5 (*)    RBC 3.28 (*)    Hemoglobin 12.1 (*)    HCT 35.7 (*)    MCV 108.8 (*)    MCH 36.9 (*)    Platelets 100 (*)    All other components within normal limits  I-STAT TROPONIN, ED    EKG EKG Interpretation  Date/Time:  Friday May 06 2018 03:33:24 EDT Ventricular Rate:  81 PR Interval:  172 QRS Duration: 84 QT Interval:  400 QTC Calculation: 464 R Axis:   89 Text Interpretation:  Normal sinus rhythm Normal ECG Confirmed by Gilda Crease [phone]) on 05/06/2018 5:44:05 AM   Radiology Dg Chest 2 View  Result Date: 05/06/2018 CLINICAL DATA:  Chest pain EXAM: CHEST - 2 VIEW COMPARISON:  05/03/2018 FINDINGS: The heart size and mediastinal contours are within normal limits. Both lungs are clear. The visualized skeletal structures are unremarkable. IMPRESSION: No active cardiopulmonary disease. Electronically Signed   By: Burman Nieves M.D.   On: 05/06/2018 04:32    Procedures Procedures (including critical care time)   Left Cardiac Catheterization, 2016 CONCLUSIONS:   CORONARY STATUS: Mild non-obstructive ASCAD   OTHER:Cath showed a left dominant system. There are mild luminal irregularities but no severe lesions. EF is 65%. Wall motion is normal.   Medications Ordered in ED Medications  ondansetron (ZOFRAN-ODT) disintegrating tablet 8 mg (has no administration in time range)     Initial Impression / Assessment and Plan / ED Course  I have reviewed the triage vital signs and the nursing notes.  Pertinent labs & imaging results that were available during my care of the patient were reviewed by me and considered in my medical decision making (see chart for details).     47 year old male presents to the emergency department for evaluation of chest pain.  He is homeless and has been seen numerous times in the ED for similar complaints.  Cardiac work-up today is reassuring.  He has history of a relatively clean cardiac catheterization in 2016  with preserved wall motion and normal EF.  Long-standing history of alcohol abuse.  He has no overt evidence of acute withdrawal at this time.  Was given Zofran in the ED for complaints of nausea.  Patient referred to primary care for follow-up.   Final Clinical Impressions(s) / ED Diagnoses   Final diagnoses:  Nonspecific chest pain    ED Discharge Orders    None       Antony Madura, PA-C 05/06/18 6045    Gilda Crease, MD 05/06/18 [phone]

## 2018-05-06 NOTE — ED Triage Notes (Signed)
Per EMS, pt is homeless and when EMS told him he could not sleep at [facility] he said "then I guess I have chest pain."  Denies SOB, says "I drank a lot".  Refused IV for EMS, given 324 ASA.

## 2018-05-06 NOTE — ED Notes (Signed)
PA J. Ward spoke with patient in triage to order labs. Pt said he just wants wants a beer and cigarette and to leave.  Pt ambulatory to exit by himself.  Discharging patient AMA from system.

## 2018-05-06 NOTE — ED Triage Notes (Signed)
Pt here by EMS for general body aches. When asked any question by staff he said he just wants a beer and to sleep. A&Ox4, normal gait.

## 2018-05-08 ENCOUNTER — Emergency Department (HOSPITAL_COMMUNITY)
Admission: EM | Admit: 2018-05-08 | Discharge: 2018-05-08 | Disposition: A | Discharge status: Home / Self Care | Payer: Medicaid Other | Attending: Emergency Medicine | Admitting: Emergency Medicine

## 2018-05-08 DIAGNOSIS — I2510 Atherosclerotic heart disease of native coronary artery without angina pectoris: Secondary | ICD-10-CM | POA: Insufficient documentation

## 2018-05-08 DIAGNOSIS — T730XXA Starvation, initial encounter: Secondary | ICD-10-CM

## 2018-05-08 DIAGNOSIS — R079 Chest pain, unspecified: Secondary | ICD-10-CM

## 2018-05-08 DIAGNOSIS — F17210 Nicotine dependence, cigarettes, uncomplicated: Secondary | ICD-10-CM | POA: Insufficient documentation

## 2018-05-08 DIAGNOSIS — R638 Other symptoms and signs concerning food and fluid intake: Secondary | ICD-10-CM | POA: Insufficient documentation

## 2018-05-08 LAB — I-STAT CHEM 8, ED
BUN: <3 mg/dL — ABNORMAL LOW (ref 6–20)
Calcium, Ion: 1.03 mmol/L — ABNORMAL LOW (ref 1.15–1.40)
Chloride: 96 mmol/L — ABNORMAL LOW (ref 98–111)
Creatinine, Ser: 1.1 mg/dL (ref 0.61–1.24)
Glucose, Bld: 95 mg/dL (ref 70–99)
HCT: 40 % (ref 39.0–52.0)
Hemoglobin: 13.6 g/dL (ref 13.0–17.0)
Potassium: 4 mmol/L (ref 3.5–5.1)
Sodium: 134 mmol/L — ABNORMAL LOW (ref 135–145)
TCO2: 27 mmol/L (ref 22–32)

## 2018-05-08 LAB — I-STAT TROPONIN, ED: Troponin i, poc: 0 ng/mL (ref 0.00–0.08)

## 2018-05-08 NOTE — ED Notes (Signed)
Pt given turkey sandwich

## 2018-05-08 NOTE — ED Provider Notes (Signed)
[facility] EMERGENCY DEPARTMENT Provider Note   CSN: [id number] Arrival date & time: 05/08/18  1903     History   Chief Complaint Chief Complaint  Patient presents with  . Hungry  . Chest Pain    HPI REUVEN BRAVER is a 47 y.o. male.  47 y/o male with a PMH of DVT,CAD presents to the ED with a chief complaint of chest pain x "for a really long time". Patient describes his chest pain as pressure all the time, "like someone is sitting on top". Patient will not answer more questions as he reports "I am eating". Unable to continue patient interview. Patient does verbalize to staff that he is hungry and wants to sleep.      Past Medical History:  Diagnosis Date  . Alcohol withdrawal (HCC) 12/07/2017   pt states he "goes into DT's"  . Coronary artery disease   . DVT (deep venous thrombosis) (HCC)     There are no active problems to display for this patient.   Past Surgical History:  Procedure Laterality Date  . CARDIAC CATHETERIZATION          Home Medications    Prior to Admission medications   Medication Sig Start Date End Date Taking? Authorizing Provider  omeprazole (PRILOSEC) 20 MG capsule Take 1 capsule (20 mg total) by mouth daily. Patient not taking: Reported on 05/03/2018 12/14/17   Horton, Mayer Masker, MD  ondansetron (ZOFRAN ODT) 4 MG disintegrating tablet Take 1 tablet (4 mg total) by mouth every 8 (eight) hours as needed for nausea or vomiting. Patient not taking: Reported on 05/03/2018 12/14/17   Horton, Mayer Masker, MD    Family History No family history on file.  Social History Social History   Tobacco Use  . Smoking status: Current Every Day Smoker    Packs/day: 1.00  . Smokeless tobacco: Never Used  Substance Use Topics  . Alcohol use: Yes    Alcohol/week: 36.0 standard drinks    Types: 36 Cans of beer per week  . Drug use: No     Allergies   Lac bovis; Penicillins; and Hydrocodone-acetaminophen   Review of  Systems Review of Systems  Respiratory: Negative for shortness of breath.   Cardiovascular: Positive for chest pain.  All other systems reviewed and are negative.    Physical Exam Updated Vital Signs BP 130/70 (BP Location: Left Arm)   Pulse 80   Temp 97.7 F (36.5 C) (Oral)   Resp 10   SpO2 100%   Physical Exam  Constitutional: He is oriented to person, place, and time. Vital signs are normal. He appears well-developed and well-nourished.  Disheveled appearance in bed covering himself with blanket.   HENT:  Head: Normocephalic and atraumatic.  Neck: Normal range of motion. Neck supple.  Pulmonary/Chest: Effort normal. He has no decreased breath sounds.  Abdominal: Soft.  Neurological: He is alert and oriented to person, place, and time.  Skin: Skin is warm and dry.  Nursing note and vitals reviewed.    ED Treatments / Results  Labs (all labs ordered are listed, but only abnormal results are displayed) Labs Reviewed  I-STAT CHEM 8, ED - Abnormal; Notable for the following components:      Result Value   Sodium 134 (*)    Chloride 96 (*)    BUN <3 (*)    Calcium, Ion 1.03 (*)    All other components within normal limits  I-STAT TROPONIN, ED    EKG  None  Radiology No results found.  Procedures Procedures (including critical care time)  Medications Ordered in ED Medications - No data to display   Initial Impression / Assessment and Plan / ED Course  I have reviewed the triage vital signs and the nursing notes.  Pertinent labs & imaging results that were available during my care of the patient were reviewed by me and considered in my medical decision making (see chart for details).    Patient reports feeling better after sandwich and states he does not want to get chest xray.Is tat chem 8 was similar to prior visits. Trop negative. EKG showed no ST elevation or depression, unchanged from previous. Patient stating "what time does the store close, I need to  get beer". He reports I just want to sleep. Patient discharged, vitals stable.   Final Clinical Impressions(s) / ED Diagnoses   Final diagnoses:  Hungry, initial encounter  Chest pain, unspecified type    ED Discharge Orders    None       Freddy Jaksch 05/08/18 2152    Azalia Bilis, MD 05/08/18 2157

## 2018-05-08 NOTE — ED Notes (Addendum)
Pt offered a shower and new clothes before d/c. Pt denied these. Stated "Im ok" Pt also given [place] sandwich bag at d/c. Pt accepted this. Pt also offered wheelchair at d/c. Pt declined. States "I'll walk."  I placed a set of paper scrubs, socks, AVS, and sandwich bag in belongings bag

## 2018-05-08 NOTE — ED Notes (Signed)
Patient verbalizes understanding of discharge instructions. Opportunity for questioning and answers were provided. Armband removed by staff, pt discharged from ED ambulatory.   

## 2018-05-08 NOTE — ED Notes (Signed)
Pt would not take the bag I packed for him. All he wanted was a cigarette and a beer. I walked pt out to front.

## 2018-05-08 NOTE — ED Triage Notes (Signed)
Pt arrived with EMS. Pt squatting at a house with other people, no power. ETOH. Pt complains of being hungry. Pt also states he is having chest pain. Pt was seen here twice on Friday.

## 2018-05-08 NOTE — ED Notes (Signed)
Pt refused xray 

## 2018-05-10 ENCOUNTER — Emergency Department (HOSPITAL_COMMUNITY)
Admission: EM | Admit: 2018-05-10 | Discharge: 2018-05-10 | Discharge status: Against Medical Advice | Payer: Medicaid Other | Attending: Emergency Medicine | Admitting: Emergency Medicine

## 2018-05-10 DIAGNOSIS — Z5321 Procedure and treatment not carried out due to patient leaving prior to being seen by health care provider: Secondary | ICD-10-CM | POA: Insufficient documentation

## 2018-05-10 DIAGNOSIS — R079 Chest pain, unspecified: Secondary | ICD-10-CM | POA: Insufficient documentation

## 2018-05-10 NOTE — ED Triage Notes (Signed)
Pt arrived [facility] from [place] [place] for c/o chest pain "for weeks"  BP 126/88 P 80 RR 16 O2 95% Pt spent a total time of 4 minutes in the triage room before refusing further vitals, pt states that he is leaving because he "doesn't want to wait" and is asking staff "where the nearest store is" pt informed of the risks of leaving without being seen by provider. Ambulated gait steady out of the door.

## 2018-05-12 ENCOUNTER — Encounter (HOSPITAL_COMMUNITY): Payer: Self-pay | Admitting: Emergency Medicine

## 2018-05-12 ENCOUNTER — Emergency Department (HOSPITAL_COMMUNITY): Payer: Medicaid Other

## 2018-05-12 ENCOUNTER — Emergency Department (HOSPITAL_COMMUNITY)
Admission: EM | Admit: 2018-05-12 | Discharge: 2018-05-12 | Disposition: A | Discharge status: Home / Self Care | Payer: Medicaid Other | Attending: Emergency Medicine | Admitting: Emergency Medicine

## 2018-05-12 ENCOUNTER — Other Ambulatory Visit: Payer: Self-pay

## 2018-05-12 ENCOUNTER — Emergency Department (HOSPITAL_COMMUNITY)
Admission: EM | Admit: 2018-05-12 | Discharge: 2018-05-13 | Disposition: A | Discharge status: Home / Self Care | Payer: Medicaid Other | Attending: Emergency Medicine | Admitting: Emergency Medicine

## 2018-05-12 DIAGNOSIS — F10920 Alcohol use, unspecified with intoxication, uncomplicated: Secondary | ICD-10-CM | POA: Insufficient documentation

## 2018-05-12 DIAGNOSIS — Z5900 Homelessness unspecified: Secondary | ICD-10-CM

## 2018-05-12 DIAGNOSIS — D696 Thrombocytopenia, unspecified: Secondary | ICD-10-CM | POA: Insufficient documentation

## 2018-05-12 DIAGNOSIS — R740 Nonspecific elevation of levels of transaminase and lactic acid dehydrogenase [LDH]: Secondary | ICD-10-CM | POA: Insufficient documentation

## 2018-05-12 DIAGNOSIS — F17200 Nicotine dependence, unspecified, uncomplicated: Secondary | ICD-10-CM | POA: Insufficient documentation

## 2018-05-12 DIAGNOSIS — R7401 Elevation of levels of liver transaminase levels: Secondary | ICD-10-CM

## 2018-05-12 DIAGNOSIS — I2510 Atherosclerotic heart disease of native coronary artery without angina pectoris: Secondary | ICD-10-CM | POA: Insufficient documentation

## 2018-05-12 DIAGNOSIS — R079 Chest pain, unspecified: Secondary | ICD-10-CM

## 2018-05-12 DIAGNOSIS — R197 Diarrhea, unspecified: Secondary | ICD-10-CM | POA: Insufficient documentation

## 2018-05-12 DIAGNOSIS — F10220 Alcohol dependence with intoxication, uncomplicated: Secondary | ICD-10-CM | POA: Insufficient documentation

## 2018-05-12 DIAGNOSIS — D539 Nutritional anemia, unspecified: Secondary | ICD-10-CM | POA: Insufficient documentation

## 2018-05-12 DIAGNOSIS — R112 Nausea with vomiting, unspecified: Secondary | ICD-10-CM | POA: Insufficient documentation

## 2018-05-12 DIAGNOSIS — D72819 Decreased white blood cell count, unspecified: Secondary | ICD-10-CM | POA: Insufficient documentation

## 2018-05-12 DIAGNOSIS — E871 Hypo-osmolality and hyponatremia: Secondary | ICD-10-CM | POA: Insufficient documentation

## 2018-05-12 LAB — RAPID URINE DRUG SCREEN, HOSP PERFORMED
Amphetamines: NOT DETECTED
Barbiturates: NOT DETECTED
Benzodiazepines: NOT DETECTED
Cocaine: NOT DETECTED
Opiates: NOT DETECTED
Tetrahydrocannabinol: NOT DETECTED

## 2018-05-12 LAB — I-STAT TROPONIN, ED
Troponin i, poc: 0.01 ng/mL (ref 0.00–0.08)
Troponin i, poc: 0.01 ng/mL (ref 0.00–0.08)

## 2018-05-12 LAB — COMPREHENSIVE METABOLIC PANEL
ALT: 51 U/L — ABNORMAL HIGH (ref 0–44)
AST: 139 U/L — ABNORMAL HIGH (ref 15–41)
Albumin: 4.1 g/dL (ref 3.5–5.0)
Alkaline Phosphatase: 100 U/L (ref 38–126)
Anion gap: 14 (ref 5–15)
BUN: <5 mg/dL — ABNORMAL LOW (ref 6–20)
CO2: 24 mmol/L (ref 22–32)
Calcium: 8.7 mg/dL — ABNORMAL LOW (ref 8.9–10.3)
Chloride: 93 mmol/L — ABNORMAL LOW (ref 98–111)
Creatinine, Ser: 0.57 mg/dL — ABNORMAL LOW (ref 0.61–1.24)
GFR calc Af Amer: >60 mL/min (ref >60)
GFR calc non Af Amer: >60 mL/min (ref >60)
Glucose, Bld: 76 mg/dL (ref 70–99)
Potassium: 3.7 mmol/L (ref 3.5–5.1)
Sodium: 131 mmol/L — ABNORMAL LOW (ref 135–145)
Total Bilirubin: 0.8 mg/dL (ref 0.3–1.2)
Total Protein: 7.4 g/dL (ref 6.5–8.1)

## 2018-05-12 LAB — LIPASE, BLOOD: Lipase: 45 U/L (ref 11–51)

## 2018-05-12 LAB — CBC WITH DIFFERENTIAL/PLATELET
Abs Immature Granulocytes: 0.01 10*3/uL (ref 0.00–0.07)
Basophils Absolute: 0 10*3/uL (ref 0.0–0.1)
Basophils Relative: 1 %
Eosinophils Absolute: 0.1 10*3/uL (ref 0.0–0.5)
Eosinophils Relative: 2 %
HCT: 36.3 % — ABNORMAL LOW (ref 39.0–52.0)
Hemoglobin: 12.5 g/dL — ABNORMAL LOW (ref 13.0–17.0)
Immature Granulocytes: 0 %
Lymphocytes Relative: 36 %
Lymphs Abs: 1 10*3/uL (ref 0.7–4.0)
MCH: 36.9 pg — ABNORMAL HIGH (ref 26.0–34.0)
MCHC: 34.4 g/dL (ref 30.0–36.0)
MCV: 107.1 fL — ABNORMAL HIGH (ref 80.0–100.0)
Monocytes Absolute: 0.3 10*3/uL (ref 0.1–1.0)
Monocytes Relative: 10 %
Neutro Abs: 1.4 10*3/uL — ABNORMAL LOW (ref 1.7–7.7)
Neutrophils Relative %: 51 %
Platelets: 111 10*3/uL — ABNORMAL LOW (ref 150–400)
RBC: 3.39 MIL/uL — ABNORMAL LOW (ref 4.22–5.81)
RDW: 14.7 % (ref 11.5–15.5)
WBC: 2.8 10*3/uL — ABNORMAL LOW (ref 4.0–10.5)
nRBC: 0 % (ref 0.0–0.2)

## 2018-05-12 LAB — ETHANOL: Alcohol, Ethyl (B): 277 mg/dL — ABNORMAL HIGH (ref <10)

## 2018-05-12 LAB — BASIC METABOLIC PANEL
Anion gap: 13 (ref 5–15)
BUN: <5 mg/dL — ABNORMAL LOW (ref 6–20)
CO2: 26 mmol/L (ref 22–32)
Calcium: 8.8 mg/dL — ABNORMAL LOW (ref 8.9–10.3)
Chloride: 94 mmol/L — ABNORMAL LOW (ref 98–111)
Creatinine, Ser: 0.58 mg/dL — ABNORMAL LOW (ref 0.61–1.24)
GFR calc Af Amer: >60 mL/min (ref >60)
GFR calc non Af Amer: >60 mL/min (ref >60)
Glucose, Bld: 94 mg/dL (ref 70–99)
Potassium: 3.7 mmol/L (ref 3.5–5.1)
Sodium: 133 mmol/L — ABNORMAL LOW (ref 135–145)

## 2018-05-12 MED ORDER — LOPERAMIDE HCL 2 MG PO CAPS
4.0000 mg | ORAL_CAPSULE | Freq: Once | ORAL | Status: AC
  Administered 2018-05-12: 4 mg via ORAL
  Filled 2018-05-12: qty 2

## 2018-05-12 MED ORDER — ONDANSETRON HCL 4 MG/2ML IJ SOLN
4.0000 mg | Freq: Once | INTRAMUSCULAR | Status: AC
  Administered 2018-05-12: 4 mg via INTRAVENOUS
  Filled 2018-05-12: qty 2

## 2018-05-12 MED ORDER — SODIUM CHLORIDE 0.9 % IV BOLUS
1000.0000 mL | Freq: Once | INTRAVENOUS | Status: AC
  Administered 2018-05-12: 1000 mL via INTRAVENOUS

## 2018-05-12 MED ORDER — ONDANSETRON HCL 4 MG PO TABS: 4.0000 mg | ORAL_TABLET | Freq: Four times a day (QID) | ORAL | 0 refills | Status: AC | PRN

## 2018-05-12 NOTE — ED Provider Notes (Signed)
[facility] EMERGENCY DEPARTMENT Provider Note   CSN: [id number] Arrival date & time: 05/12/18  0347     History   Chief Complaint Chief Complaint  Patient presents with  . Chest Pain  . Alcohol Intoxication    HPI Derek Norton is a 47 y.o. male.  The history is provided by the patient.  Chest Pain    Alcohol Intoxication  Associated symptoms include chest pain.  He has history of coronary artery disease, DVT, alcohol abuse and comes in complaining of vomiting and diarrhea throughout the day.  He states he has not been able to hold anything on his stomach.  When asked how many times he vomited, he stated that he could not count that many.  He also states he has had numerous watery bowel movements but is unable to quantitate the number.  He does admit to drinking alcohol, just stating that it was "more than he should".  He denies abdominal pain.  He has chronic back and leg pain which is unchanged from baseline.  He denies fever or chills or sweats.  He denies drug use other than alcohol.  He states that he just wants to get some fluid in his system.  Past Medical History:  Diagnosis Date  . Alcohol withdrawal (HCC) 12/07/2017   pt states he "goes into DT's"  . Coronary artery disease   . DVT (deep venous thrombosis) (HCC)     There are no active problems to display for this patient.   Past Surgical History:  Procedure Laterality Date  . CARDIAC CATHETERIZATION          Home Medications    Prior to Admission medications   Medication Sig Start Date End Date Taking? Authorizing Provider  omeprazole (PRILOSEC) 20 MG capsule Take 1 capsule (20 mg total) by mouth daily. Patient not taking: Reported on 05/03/2018 12/14/17   Horton, Mayer Masker, MD  ondansetron (ZOFRAN ODT) 4 MG disintegrating tablet Take 1 tablet (4 mg total) by mouth every 8 (eight) hours as needed for nausea or vomiting. Patient not taking: Reported on 05/03/2018 12/14/17   Horton,  Mayer Masker, MD    Family History History reviewed. No pertinent family history.  Social History Social History   Tobacco Use  . Smoking status: Current Every Day Smoker    Packs/day: 1.00  . Smokeless tobacco: Never Used  Substance Use Topics  . Alcohol use: Yes    Alcohol/week: 36.0 standard drinks    Types: 36 Cans of beer per week  . Drug use: No     Allergies   Lac bovis; Penicillins; and Hydrocodone-acetaminophen   Review of Systems Review of Systems  Cardiovascular: Positive for chest pain.  All other systems reviewed and are negative.    Physical Exam Updated Vital Signs BP 93/63 (BP Location: Right Arm)   Pulse 77   Temp 97.8 F (36.6 C) (Axillary)   Resp 14   SpO2 99%   Physical Exam  Nursing note and vitals reviewed.  47 year old male, resting comfortably and in no acute distress. Vital signs are normal. Oxygen saturation is 99%, which is normal. Head is normocephalic and atraumatic. PERRLA, EOMI. Oropharynx is clear. Neck is nontender and supple without adenopathy or JVD. Back is nontender and there is no CVA tenderness. Lungs are clear without rales, wheezes, or rhonchi. Chest is nontender. Heart has regular rate and rhythm without murmur. Abdomen is soft, flat, nontender without masses or hepatosplenomegaly and peristalsis is hypoactive.  Extremities have no cyanosis or edema, full range of motion is present. Skin is warm and dry without rash. Neurologic: Mental status is normal, cranial nerves are intact, there are no motor or sensory deficits.  ED Treatments / Results  Labs (all labs ordered are listed, but only abnormal results are displayed) Labs Reviewed  COMPREHENSIVE METABOLIC PANEL - Abnormal; Notable for the following components:      Result Value   Sodium 131 (*)    Chloride 93 (*)    BUN <5 (*)    Creatinine, Ser 0.57 (*)    Calcium 8.7 (*)    AST 139 (*)    ALT 51 (*)    All other components within normal limits  ETHANOL -  Abnormal; Notable for the following components:   Alcohol, Ethyl (B) 277 (*)    All other components within normal limits  CBC WITH DIFFERENTIAL/PLATELET - Abnormal; Notable for the following components:   WBC 2.8 (*)    RBC 3.39 (*)    Hemoglobin 12.5 (*)    HCT 36.3 (*)    MCV 107.1 (*)    MCH 36.9 (*)    Platelets 111 (*)    All other components within normal limits  LIPASE, BLOOD  RAPID URINE DRUG SCREEN, HOSP PERFORMED  I-STAT TROPONIN, ED   Procedures Procedures  Medications Ordered in ED Medications  sodium chloride 0.9 % bolus 1,000 mL (0 mLs Intravenous Stopped 05/12/18 0603)  ondansetron (ZOFRAN) injection 4 mg (4 mg Intravenous Given 05/12/18 0503)  loperamide (IMODIUM) capsule 4 mg (4 mg Oral Given 05/12/18 0503)     Initial Impression / Assessment and Plan / ED Course  I have reviewed the triage vital signs and the nursing notes.  Pertinent labs & imaging results that were available during my care of the patient were reviewed by me and considered in my medical decision making (see chart for details).  Vomiting and diarrhea.  We will give IV fluids, ondansetron and oral loperamide.  Old records are reviewed, and he has numerous ED visits for alcohol related issues as well as visits for nausea and vomiting and diarrhea.  This is his 17th ED visit in the last 22 days.  Alcohol levels come back at 277.  Apparently, he was able to hold some liquids on his stomach.  Metabolic panel does show elevation of transaminases above baseline.  Mild hyponatremia is present which is slightly worse than baseline, but probably not clinically significant.  Macrocytic anemia is present unchanged from baseline.  Leukopenia and thrombocytopenia are present, unchanged from baseline and are felt to be related to alcohol use.  He is discharged with prescription for ondansetron and given outpatient resource guide for her substance abuse.  Final Clinical Impressions(s) / ED Diagnoses   Final  diagnoses:  Nausea vomiting and diarrhea  Alcohol intoxication, uncomplicated (HCC)  Hyponatremia  Elevated transaminase level    ED Discharge Orders         Ordered    ondansetron (ZOFRAN) 4 MG tablet  Every 6 hours PRN     05/12/18 1610           Dione Booze, MD 05/12/18 [phone]

## 2018-05-12 NOTE — Discharge Instructions (Addendum)
Take loperamide (Imodium AD) as needed for diarrhea.  Your blood tests show that your drinking is starting to hurt your liver.  I strongly suggest that you stop drinking.  You are being given some resources that can help you with your drinking problem.

## 2018-05-12 NOTE — ED Triage Notes (Addendum)
Pt to ED via [facility] with c/o chest pain.  Pt was laying on the ground at [employer].   Pt admits to "too much to drink today"

## 2018-05-12 NOTE — ED Triage Notes (Signed)
  Patient BIB EMS for sudden onset chest pain.  Patient was sleeping outside the [employer] station and called EMS requesting transport to [facility].  Patient states he is having mid sternal chest pain and pain in his lower back and legs.  Patient has been drinking beer all day and states he lost count on how many he has drank.  Patient was given 324 ASA via EMS.  Patient is homeless and is refusing most cares.  Patient states he just wants to sleep and be left alone.  Patient A&O x4.

## 2018-05-13 LAB — CBC
HCT: 36.9 % — ABNORMAL LOW (ref 39.0–52.0)
Hemoglobin: 12.6 g/dL — ABNORMAL LOW (ref 13.0–17.0)
MCH: 37.1 pg — ABNORMAL HIGH (ref 26.0–34.0)
MCHC: 34.1 g/dL (ref 30.0–36.0)
MCV: 108.5 fL — ABNORMAL HIGH (ref 80.0–100.0)
Platelets: 122 10*3/uL — ABNORMAL LOW (ref 150–400)
RBC: 3.4 MIL/uL — ABNORMAL LOW (ref 4.22–5.81)
RDW: 15 % (ref 11.5–15.5)
WBC: 3.2 10*3/uL — ABNORMAL LOW (ref 4.0–10.5)
nRBC: 0 % (ref 0.0–0.2)

## 2018-05-13 NOTE — Discharge Instructions (Addendum)
Follow up with your doctor or any Urgent Care as needed. You can use the resources given to you last night for alcoholism if you want to pursue treatment.

## 2018-05-13 NOTE — ED Provider Notes (Signed)
[facility] EMERGENCY DEPARTMENT Provider Note   CSN: [id number] Arrival date & time: 05/12/18  2211     History   Chief Complaint Chief Complaint  Patient presents with  . Chest Pain    HPI Derek Norton is a 47 y.o. male.  Patient well known to the emergency department with 18 visits, no admissions, in the last 6 months, including last night when he presented with similar complaints as tonight. He complains of chest pain, vomiting, diarrhea. He is intoxicated but responds appropriately and is able to provide history. No SOB. He is not aware of any fever. No hematemesis. No modifying factors affecting chest pain.  The history is provided by the patient. No language interpreter was used.  Chest Pain   Associated symptoms include vomiting. Pertinent negatives include no fever.    Past Medical History:  Diagnosis Date  . Alcohol withdrawal (HCC) 12/07/2017   pt states he "goes into DT's"  . Coronary artery disease   . DVT (deep venous thrombosis) (HCC)     There are no active problems to display for this patient.   Past Surgical History:  Procedure Laterality Date  . CARDIAC CATHETERIZATION          Home Medications    Prior to Admission medications   Medication Sig Start Date End Date Taking? Authorizing Provider  ondansetron (ZOFRAN) 4 MG tablet Take 1 tablet (4 mg total) by mouth every 6 (six) hours as needed for nausea or vomiting. 05/12/18   Dione Booze, MD    Family History No family history on file.  Social History Social History   Tobacco Use  . Smoking status: Current Every Day Smoker    Packs/day: 1.00  . Smokeless tobacco: Never Used  Substance Use Topics  . Alcohol use: Yes    Alcohol/week: 36.0 standard drinks    Types: 36 Cans of beer per week  . Drug use: No     Allergies   Lac bovis; Penicillins; and Hydrocodone-acetaminophen   Review of Systems Review of Systems  Constitutional: Negative for chills and  fever.  HENT: Negative.   Respiratory: Negative.   Cardiovascular: Positive for chest pain.  Gastrointestinal: Positive for diarrhea and vomiting.  Musculoskeletal: Negative.   Skin: Negative.   Neurological: Negative.      Physical Exam Updated Vital Signs BP 104/68 (BP Location: Right Arm)   Pulse 84   Temp 97.9 F (36.6 C) (Oral)   Resp 16   Ht 5\' 11"  (1.803 m)   Wt 74.8 kg   SpO2 95%   BMI 23.00 kg/m   Physical Exam  Constitutional: He is oriented to person, place, and time. He appears well-developed and well-nourished.  HENT:  Head: Normocephalic.  Neck: Normal range of motion. Neck supple.  Cardiovascular: Normal rate and regular rhythm.  Pulmonary/Chest: Effort normal and breath sounds normal. He has no wheezes. He has no rhonchi. He has no rales.  Abdominal: Soft. Bowel sounds are normal. There is no tenderness. There is no rebound and no guarding.  Musculoskeletal: Normal range of motion.  Neurological: He is alert and oriented to person, place, and time.  Skin: Skin is warm and dry. No rash noted.  Psychiatric: He has a normal mood and affect.     ED Treatments / Results  Labs (all labs ordered are listed, but only abnormal results are displayed) Labs Reviewed  BASIC METABOLIC PANEL - Abnormal; Notable for the following components:      Result  Value   Sodium 133 (*)    Chloride 94 (*)    BUN <5 (*)    Creatinine, Ser 0.58 (*)    Calcium 8.8 (*)    All other components within normal limits  CBC - Abnormal; Notable for the following components:   WBC 3.2 (*)    RBC 3.40 (*)    Hemoglobin 12.6 (*)    HCT 36.9 (*)    MCV 108.5 (*)    MCH 37.1 (*)    Platelets 122 (*)    All other components within normal limits  I-STAT TROPONIN, ED    EKG None  Radiology Dg Chest 2 View  Result Date: 05/12/2018 CLINICAL DATA:  Chest pain and short of breath EXAM: CHEST - 2 VIEW COMPARISON:  05/12/2018, 05/06/2018, 05/03/2018 FINDINGS: No acute opacity or  pleural effusion. Normal heart size. No pneumothorax. Old appearing left rib fractures. IMPRESSION: No active cardiopulmonary disease. Electronically Signed   By: Jasmine Pang M.D.   On: 05/12/2018 22:57   Dg Chest 2 View  Result Date: 05/12/2018 CLINICAL DATA:  47 y/o  M; chest pain. EXAM: CHEST - 2 VIEW COMPARISON:  05/06/2018 chest radiograph FINDINGS: Stable heart size and mediastinal contours are within normal limits. Both lungs are clear. The visualized skeletal structures are unremarkable. IMPRESSION: No acute pulmonary process identified. Electronically Signed   By: Mitzi Hansen M.D.   On: 05/12/2018 04:46    Procedures Procedures (including critical care time)  Medications Ordered in ED Medications - No data to display   Initial Impression / Assessment and Plan / ED Course  I have reviewed the triage vital signs and the nursing notes.  Pertinent labs & imaging results that were available during my care of the patient were reviewed by me and considered in my medical decision making (see chart for details).     Patient has no vomiting in the ED. No diarrhea. He is intoxicated but coherent. Normal vital signs. No evidence withdrawal.   He complains of chest pain, vomiting, diarrhea. Labs are unchanged from baseline. He has been ambulatory. He is considered stable for discharge.   Final Clinical Impressions(s) / ED Diagnoses   Final diagnoses:  None   1. Chest pain, nonspecific 2. Chronic alcohol use 3. Vomiting  ED Discharge Orders    None       Elpidio Anis, PA-C 05/13/18 1610    Nira Conn, MD 05/14/18 0530

## 2019-12-29 IMAGING — CT CT HEAD W/O CM
3 series · 16 of 47 positions shown, 19 images · non-contrast
Comparison: 11/12/2016 chest radiograph

CLINICAL DATA: 46 y/o M; fall with head injury 2 days ago with
persistent dizziness.

EXAM:
CT HEAD WITHOUT CONTRAST
TECHNIQUE: Contiguous axial images were obtained from the base of the skull
through the vertex without intravenous contrast.

[Series 2: head trauma wo · axial · 0.48mm/px · z∈[+1192,+1317]mm · 10 of 30 slices shown, 13 images]
[im 3/30  brain]
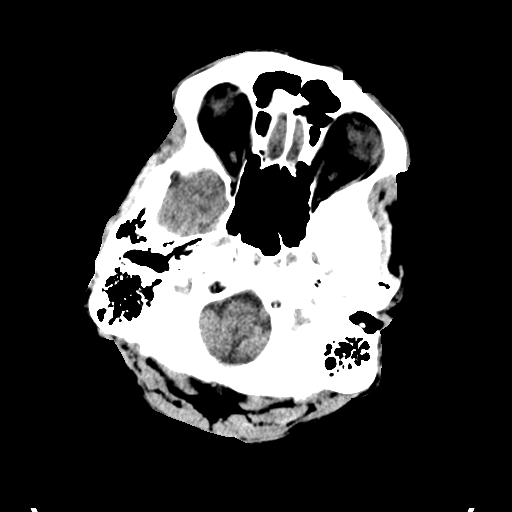
[im 3/30  bone]
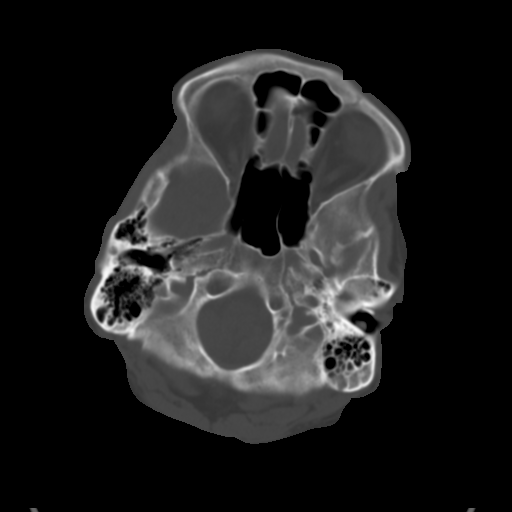
[im 6/30  brain]
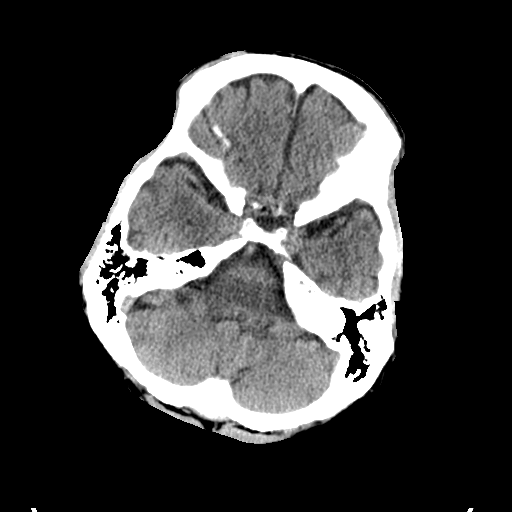
[im 9/30  brain]
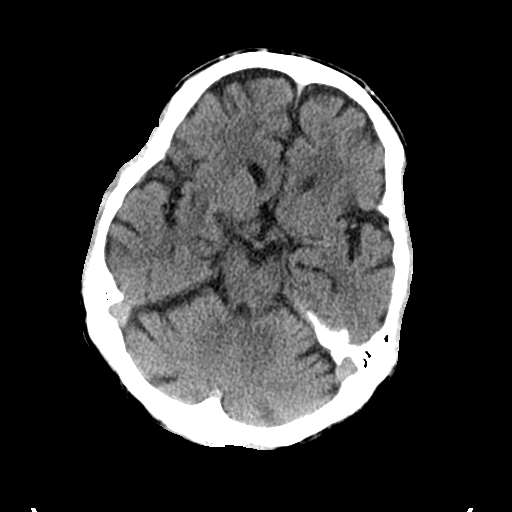
[im 11/30  brain]
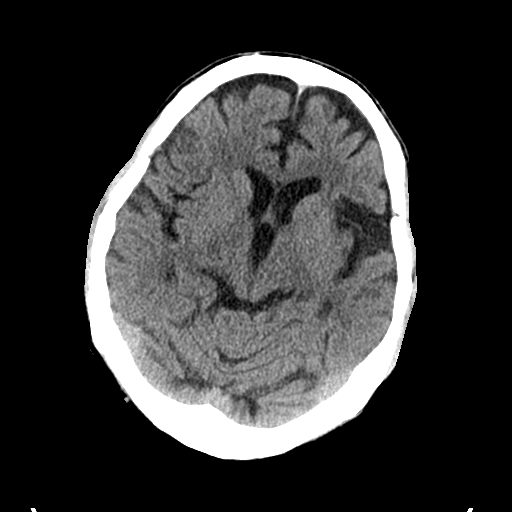
[im 14/30  brain]
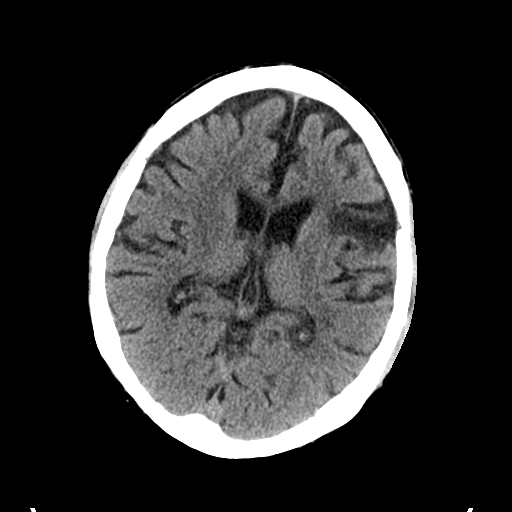
[im 14/30  bone]
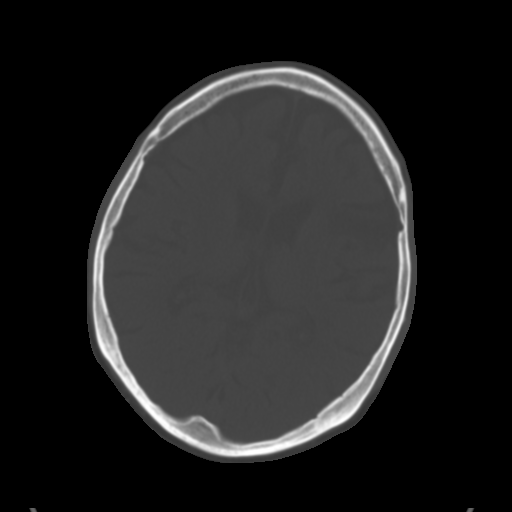
[im 17/30  brain]
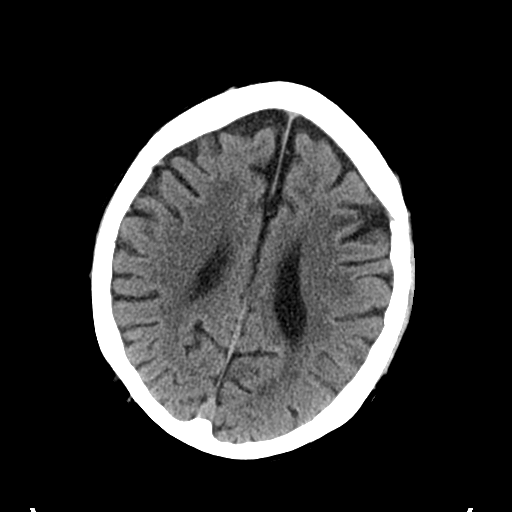
[im 20/30  brain]
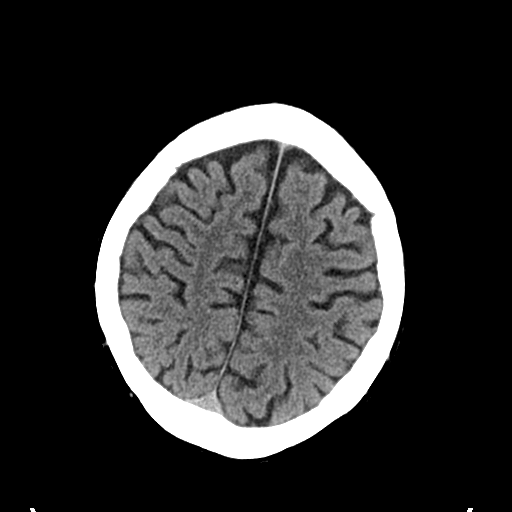
[im 23/30  brain]
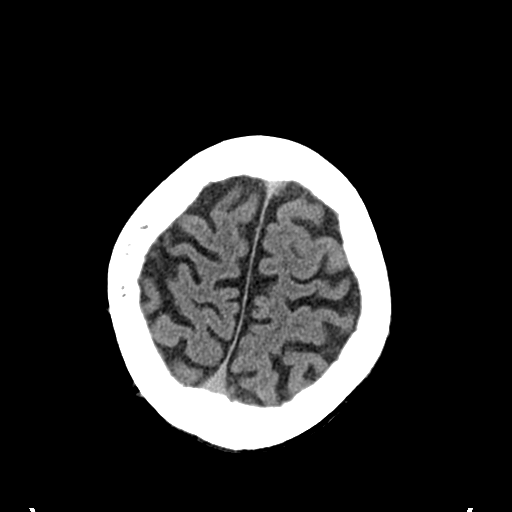
[im 25/30  brain]
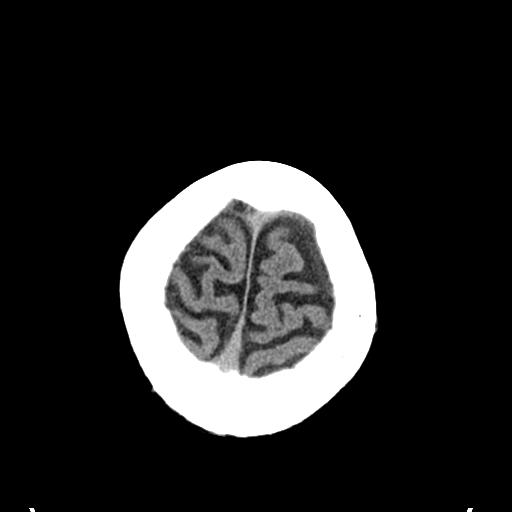
[im 25/30  bone]
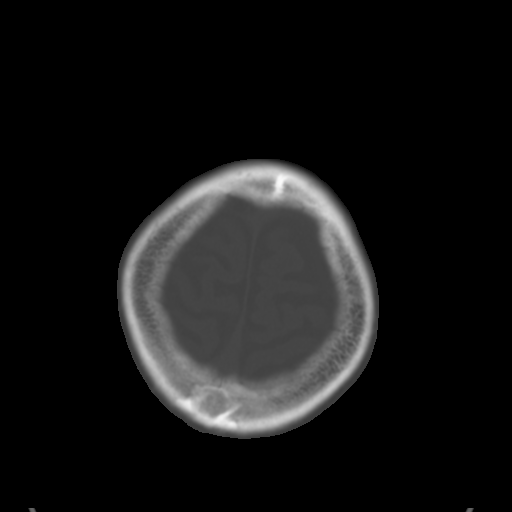
[im 28/30  brain]
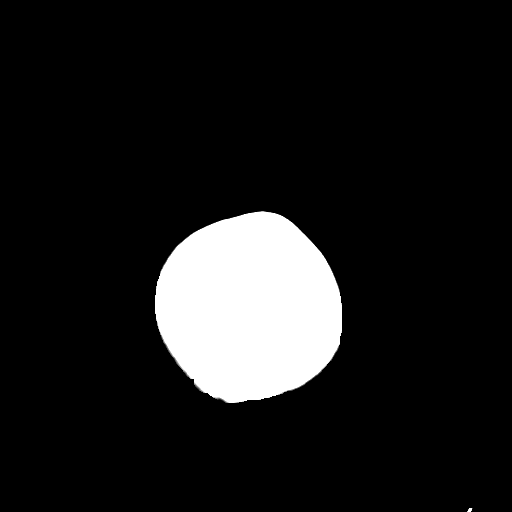

[Series 4: coronal soft tissue · coronal · 0.33mm/px · 3 of 67 slices shown]
[im 23/67  brain]
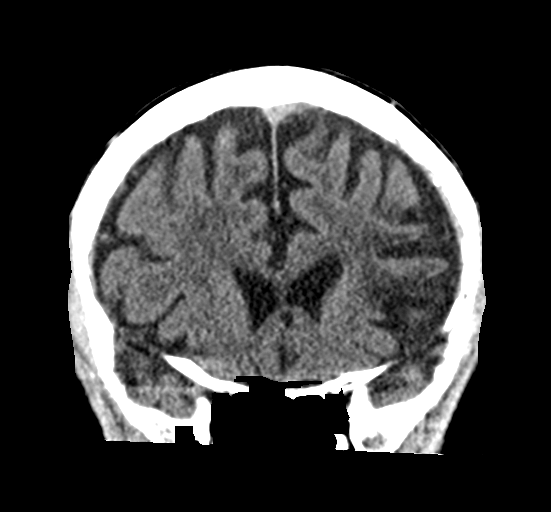
[im 30/67  brain]
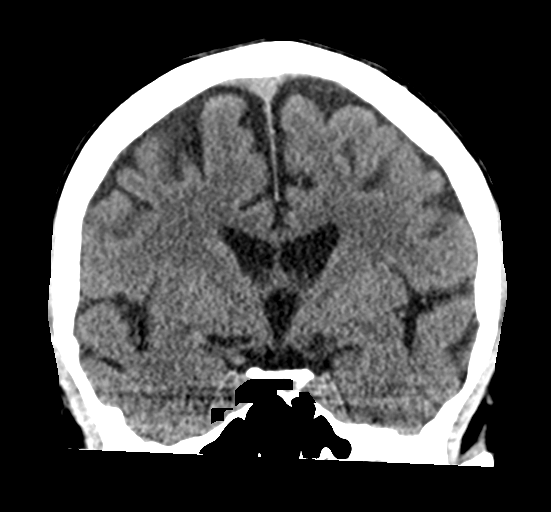
[im 37/67  brain]
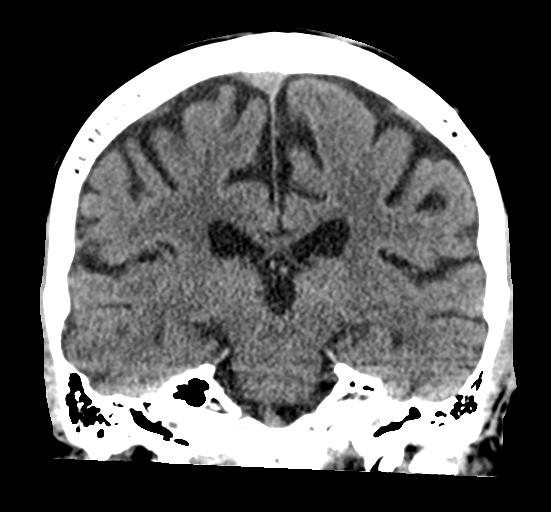

[Series 5: sagittal soft tissue · sagittal · 0.34mm/px · 3 of 57 slices shown]
[im 19/57  brain]
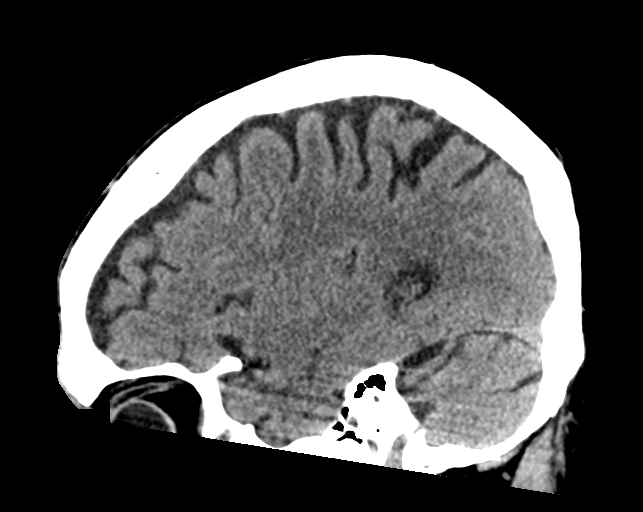
[im 29/57  brain]
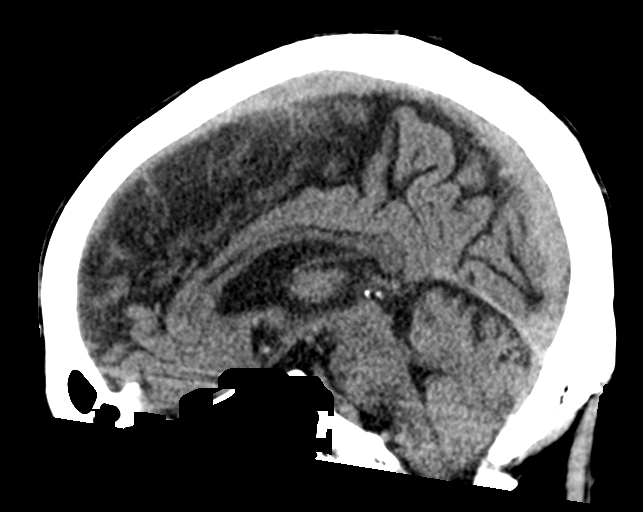
[im 38/57  brain]
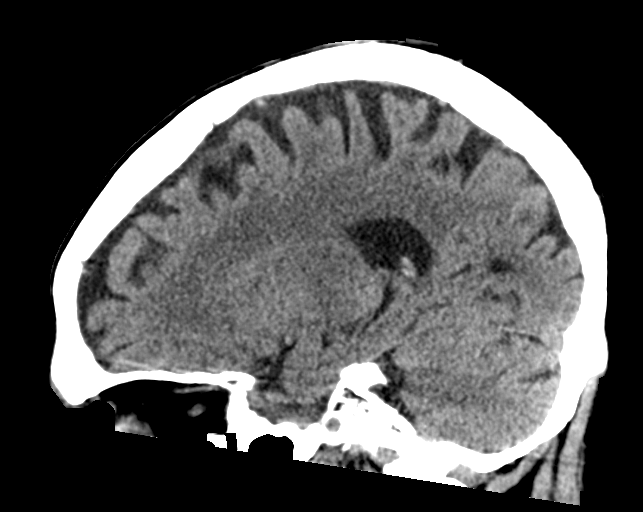

[16 of 47 positions shown; findings below may reference images not displayed]

FINDINGS: Brain: No evidence of acute infarction, hemorrhage, hydrocephalus,
extra-axial collection or mass lesion/mass effect. Stable chronic
infarction within the left anterior insula and frontal operculum.
Stable mild chronic microvascular ischemic changes and advanced
parenchymal volume loss of the brain.

Vascular: No hyperdense vessel or unexpected calcification.

Skull: Normal. Negative for fracture or focal lesion.

Sinuses/Orbits: No acute finding.

Other: None.
IMPRESSION: 1. No acute intracranial abnormality identified.
2. Stable mild chronic microvascular ischemic changes and advanced
parenchymal volume loss of the brain. Stable small chronic
infarction involving left anterior insula and frontal operculum.

By: Naasunnguaq Kalkerup M.D.
